# Patient Record
Sex: Male | Born: 1950 | Race: Black or African American | Hispanic: No | Marital: Single | Smoking: Never smoker
Health system: Southern US, Community
[De-identification: ages and names within clinical notes are randomized; demographics above are authoritative.]

## PROBLEM LIST (undated history)

## (undated) DIAGNOSIS — J189 Pneumonia, unspecified organism: Secondary | ICD-10-CM

## (undated) DIAGNOSIS — I10 Essential (primary) hypertension: Secondary | ICD-10-CM

## (undated) DIAGNOSIS — E119 Type 2 diabetes mellitus without complications: Secondary | ICD-10-CM

## (undated) DIAGNOSIS — N189 Chronic kidney disease, unspecified: Secondary | ICD-10-CM

## (undated) DIAGNOSIS — C61 Malignant neoplasm of prostate: Secondary | ICD-10-CM

## (undated) DIAGNOSIS — E039 Hypothyroidism, unspecified: Secondary | ICD-10-CM

## (undated) DIAGNOSIS — K219 Gastro-esophageal reflux disease without esophagitis: Secondary | ICD-10-CM

## (undated) DIAGNOSIS — N289 Disorder of kidney and ureter, unspecified: Secondary | ICD-10-CM

## (undated) DIAGNOSIS — E7800 Pure hypercholesterolemia, unspecified: Secondary | ICD-10-CM

---

## 1989-01-01 HISTORY — PX: APPENDECTOMY: SHX54

## 2003-01-02 HISTORY — PX: COLONOSCOPY: SHX174

## 2008-03-27 ENCOUNTER — Emergency Department (HOSPITAL_COMMUNITY): Admission: EM | Admit: 2008-03-27 | Discharge: 2008-03-27 | Payer: Self-pay | Admitting: Family Medicine

## 2008-07-31 ENCOUNTER — Emergency Department (HOSPITAL_COMMUNITY): Admission: EM | Admit: 2008-07-31 | Discharge: 2008-07-31 | Payer: Self-pay | Admitting: Emergency Medicine

## 2010-04-13 LAB — GLUCOSE, CAPILLARY: Glucose-Capillary: 140 mg/dL — ABNORMAL HIGH (ref 70–99)

## 2010-04-13 LAB — WOUND CULTURE

## 2010-05-16 NOTE — Op Note (Signed)
NAME:  Johnny Smith, Johnny Smith NO.:  [id number]   MEDICAL RECORD NO.:  [id number]          PATIENT TYPE:  EMS   LOCATION:  [facility]                         FACILITY:  [facility]   PHYSICIAN:  Loreta Ave, MD DATE OF BIRTH:  [birth year]   DATE OF PROCEDURE:  03/27/2008  DATE OF DISCHARGE:  03/27/2008                               OPERATIVE REPORT   PREPROCEDURE DIAGNOSIS:  Right long finger infection.   POSTPROCEDURE DIAGNOSIS:  Right long finger infection.   PROCEDURE:  Incision and drainage of right long finger abscess.   SURGEON:  Loreta Ave, MD   ANESTHESIA:  Local.   COMPLICATIONS:  None.   DRAINS:  None.   ESTIMATED BLOOD LOSS:  Minimal.   CLINICAL INDICATIONS:  [name] is a 60 year old diabetic male with  a right long finger infection.  He has developed a right long finger  infection over the last week that has progressed and is fluctuant and  pointing.   After discussion of the risks of surgery which include but are not  limited to stiffness, scarring, bleeding, infection that is ongoing,  damage to the nearby structures and the need for future surgery.  Ezri  understands these risks and desires to proceed.   DESCRIPTION OF PROCEDURE:  The patient was in his emergency room bed for  the duration of the procedure.  The right hand was prepped with Betadine  paint and the long finger was draped into a sterile field.  A 6 mL of a  combination of 1% lidocaine with 1:100,000 epinephrine mixed equally  with 0.5% Marcaine plain were injected to provide digital block to the  right long finger.  The abscess was located over the proximal phalanx  dorsally and extended 1.5 cm transversely and 1 cm longitudinally with  surrounding 1.5 cm halo of erythema.  For this reason, a transverse  incision was made over the abscess itself.  Purulence was encountered,  which was cultured and sent to microbiology, labeled right long finger  abscess cavity.  Next,  the wound was irrigated with 100 mL of normal  saline and Iodoform packing was placed in the wound.  A soft sterile  finger dressing was then applied.  The patient experienced no  complications, and there was minimal blood loss.     Loreta Ave, MD  Electronically Signed    CF/MEDQ  D:  03/28/2008  T:  03/29/2008  Job:  [id number]

## 2012-07-07 ENCOUNTER — Encounter (HOSPITAL_COMMUNITY): Payer: Self-pay | Admitting: *Deleted

## 2012-07-07 ENCOUNTER — Emergency Department (HOSPITAL_COMMUNITY)
Admission: EM | Admit: 2012-07-07 | Discharge: 2012-07-07 | Disposition: A | Discharge status: Home / Self Care | Payer: Self-pay | Source: Home / Self Care | Attending: Emergency Medicine | Admitting: Emergency Medicine

## 2012-07-07 DIAGNOSIS — N471 Phimosis: Secondary | ICD-10-CM

## 2012-07-07 DIAGNOSIS — N478 Other disorders of prepuce: Secondary | ICD-10-CM

## 2012-07-07 DIAGNOSIS — N481 Balanitis: Secondary | ICD-10-CM

## 2012-07-07 DIAGNOSIS — N476 Balanoposthitis: Secondary | ICD-10-CM

## 2012-07-07 HISTORY — DX: Essential (primary) hypertension: I10

## 2012-07-07 HISTORY — DX: Pure hypercholesterolemia, unspecified: E78.00

## 2012-07-07 HISTORY — DX: Gastro-esophageal reflux disease without esophagitis: K21.9

## 2012-07-07 HISTORY — DX: Type 2 diabetes mellitus without complications: E11.9

## 2012-07-07 MED ORDER — SULFAMETHOXAZOLE-TMP DS 800-160 MG PO TABS: 1.0000 | ORAL_TABLET | Freq: Two times a day (BID) | ORAL | Status: DC

## 2012-07-07 MED ORDER — NYSTATIN 100000 UNIT/GM EX CREA: TOPICAL_CREAM | CUTANEOUS | Status: DC

## 2012-07-07 MED ORDER — CEPHALEXIN 500 MG PO CAPS: 500.0000 mg | ORAL_CAPSULE | Freq: Three times a day (TID) | ORAL | Status: DC

## 2012-07-07 NOTE — ED Provider Notes (Signed)
Chief Complaint:   Chief Complaint  Patient presents with  . Penile Discharge    History of Present Illness:   Johnny Smith is a 62 year old diabetic male who has had a one-week history of phimosis. His foreskin has been swollen, slightly painful and irritated feeling, and he's had some discharge. He's been unable to retract his foreskin. He is able urinate well. He denies any fever or chills. No abdominal pain or inguinal adenopathy. His blood sugars under good control. His most recent blood sugar was 135 a couple days ago. He denies polyuria, polydipsia, or blurry vision. The patient states has been years since he's been sexually active due to erectile dysfunction. [place] diabetes control and does not have a local primary care Dr. or diabetes specialist.  Review of Systems:  Other than noted above, the patient denies any of the following symptoms: General:  No fevers, chills, sweats, aches, or fatigue. GI:  No abdominal pain, back pain, nausea, vomiting, diarrhea, or constipation. GU:  No dysuria, frequency, urgency, hematuria, urethral discharge, penile lesions, penile pain, testicular pain, swelling, or mass, inguinal lymphadenopathy or incontinence.  PMFSH:  Past medical history, family history, social history, meds, and allergies were reviewed.  He has diabetes, hypertension, hyperlipidemia, and GERD. Current meds include amlodipine/DL Sartain, Nexium, glipizide, Synthroid, metoprolol, Crestor, and Januvia.  Physical Exam:   Vital signs:  BP 147/91  Pulse 86  Temp(Src) 98.2 F (36.8 C) (Oral)  Resp 16  SpO2 100% Gen:  Alert, oriented, in no distress. Lungs:  Clear to auscultation, no wheezes, rales or rhonchi. Heart:  Regular rhythm, no gallop or murmer. Abdomen:  Flat and soft.  No tenderness to palpation, guarding, or rebound.  No hepato-splenomegaly or mass.  Bowel sounds were normally active.  No hernia. Genital exam:  The foreskin  is slightly swollen, erythematous, and there is cracking of the foreskin with some meal and drainage. The foreskin could not be retracted. Back:  No CVA tenderness.  Skin:  Clear, warm and dry.  Labs:   Results for orders placed during the hospital encounter of 07/07/12  GLUCOSE, CAPILLARY      Result Value Range   Glucose-Capillary 129 (*) 70 - 99 mg/dL    Assessment: The primary encounter diagnosis was Phimosis. A diagnosis of Balanitis was also pertinent to this visit.   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 He will need a circumcision. He has no insurance. I suggested that he followup with the [facility] and [facility], get an orange card, and get referred to urology.  Plan:   1.  The following meds were prescribed:   New Prescriptions   CEPHALEXIN (KEFLEX) 500 MG CAPSULE    Take 1 capsule (500 mg total) by mouth 3 (three) times daily.   NYSTATIN CREAM (MYCOSTATIN)    Apply to affected area 2 times daily   SULFAMETHOXAZOLE-TRIMETHOPRIM (BACTRIM DS) 800-160 MG PER TABLET    Take 1 tablet by mouth 2 (two) times daily.   2.  The patient was instructed in symptomatic care and handouts were given. 3.  The patient was told to return if becoming worse in any way, if no better in 3 or 4 days, and given some red flag symptoms such as difficulty urinating that would indicate earlier return. 4.  Follow up with Dr. Margarita Grizzle for circumcision. Also suggest he followup with Dr. Standley Dakins at the [facility] and [facility]  for diabetes control.     Reuben Likes, MD 07/07/12 [phone]

## 2012-07-07 NOTE — ED Notes (Signed)
States he is uncircumcised and he can't retract his penis to clean and has a slight discharge since Thursday.

## 2012-07-16 ENCOUNTER — Ambulatory Visit: Payer: Self-pay

## 2012-07-16 ENCOUNTER — Encounter: Payer: Self-pay | Admitting: Family Medicine

## 2012-07-16 ENCOUNTER — Ambulatory Visit: Payer: No Typology Code available for payment source | Attending: Family Medicine | Admitting: Family Medicine

## 2012-07-16 VITALS — BP 123/79 | HR 54 | Temp 98.5°F | Resp 18 | Ht 70.0 in | Wt 178.4 lb

## 2012-07-16 DIAGNOSIS — E039 Hypothyroidism, unspecified: Secondary | ICD-10-CM | POA: Insufficient documentation

## 2012-07-16 DIAGNOSIS — N471 Phimosis: Secondary | ICD-10-CM

## 2012-07-16 DIAGNOSIS — N478 Other disorders of prepuce: Secondary | ICD-10-CM

## 2012-07-16 DIAGNOSIS — N476 Balanoposthitis: Secondary | ICD-10-CM

## 2012-07-16 DIAGNOSIS — E119 Type 2 diabetes mellitus without complications: Secondary | ICD-10-CM

## 2012-07-16 DIAGNOSIS — N481 Balanitis: Secondary | ICD-10-CM

## 2012-07-16 DIAGNOSIS — E785 Hyperlipidemia, unspecified: Secondary | ICD-10-CM | POA: Insufficient documentation

## 2012-07-16 LAB — CBC
HCT: 40.4 % (ref 39.0–52.0)
Hemoglobin: 13.4 g/dL (ref 13.0–17.0)
MCH: 29.5 pg (ref 26.0–34.0)
MCV: 88.8 fL (ref 78.0–100.0)
RBC: 4.55 MIL/uL (ref 4.22–5.81)
WBC: 4.9 10*3/uL (ref 4.0–10.5)

## 2012-07-16 NOTE — Progress Notes (Signed)
07/16/12 Present to clinic f/u from ER visit penile swelling and  Foreskin infection.  P.Melissaann Dizdarevic,RN BSN [facility]

## 2012-07-16 NOTE — Patient Instructions (Addendum)
Blood Sugar Monitoring, Adult GLUCOSE METERS FOR SELF-MONITORING OF BLOOD GLUCOSE  It is important to be able to correctly measure your blood sugar (glucose). You can use a blood glucose monitor (a small battery-operated device) to check your glucose level at any time. This allows you and your caregiver to monitor your diabetes and to determine how well your treatment plan is working. The process of monitoring your blood glucose with a glucose meter is called self-monitoring of blood glucose (SMBG). When people with diabetes control their blood sugar, they have better health. To test for glucose with a typical glucose meter, place the disposable strip in the meter. Then place a small sample of blood on the "test strip." The test strip is coated with chemicals that combine with glucose in blood. The meter measures how much glucose is present. The meter displays the glucose level as a number. Several new models can record and store a number of test results. Some models can connect to personal computers to store test results or print them out.  Newer meters are often easier to use than older models. Some meters allow you to get blood from places other than your fingertip. Some new models have automatic timing, error codes, signals, or barcode readers to help with proper adjustment (calibration). Some meters have a large display screen or spoken instructions for people with visual impairments.  INSTRUCTIONS FOR USING GLUCOSE METERS  Wash your hands with soap and warm water, or clean the area with alcohol. Dry your hands completely.  Prick the side of your fingertip with a lancet (a sharp-pointed tool used by hand).  Hold the hand down and gently milk the finger until a small drop of blood appears. Catch the blood with the test strip.  Follow the instructions for inserting the test strip and using the SMBG meter. Most meters require the meter to be turned on and the test strip to be inserted before applying  the blood sample.  Record the test result.  Read the instructions carefully for both the meter and the test strips that go with it. Meter instructions are found in the user manual. Keep this manual to help you solve any problems that may arise. Many meters use "error codes" when there is a problem with the meter, the test strip, or the blood sample on the strip. You will need the manual to understand these error codes and fix the problem.  New devices are available such as laser lancets and meters that can test blood taken from "alternative sites" of the body, other than fingertips. However, you should use standard fingertip testing if your glucose changes rapidly. Also, use standard testing if:  You have eaten, exercised, or taken insulin in the past 2 hours.  You think your glucose is low.  You tend to not feel symptoms of low blood glucose (hypoglycemia).  You are ill or under stress.  Clean the meter as directed by the manufacturer.  Test the meter for accuracy as directed by the manufacturer.  Take your meter with you to your caregiver's office. This way, you can test your glucose in front of your caregiver to make sure you are using the meter correctly. Your caregiver can also take a sample of blood to test using a routine lab method. If values on the glucose meter are close to the lab results, you and your caregiver will see that your meter is working well and you are using good technique. Your caregiver will advise you about what   to do if the results do not match. FREQUENCY OF TESTING  Your caregiver will tell you how often you should check your blood glucose. This will depend on your type of diabetes, your current level of diabetes control, and your types of medicines. The following are general guidelines, but your care plan may be different. Record all your readings and the time of day you took them for review with your caregiver.   Diabetes type 1.  When you are using insulin  with good diabetic control (either multiple daily injections or via a pump), you should check your glucose 4 times a day.  If your diabetes is not well controlled, you may need to monitor more frequently, including before meals and 2 hours after meals, at bedtime, and occasionally between 2 a.m. and 3 a.m.  You should always check your glucose before a dose of insulin or before changing the rate on your insulin pump.  Diabetes type 2.  Guidelines for SMBG in diabetes type 2 are not as well defined.  If you are on insulin, follow the guidelines above.  If you are on medicines, but not insulin, and your glucose is not well controlled, you should test at least twice daily.  If you are not on insulin, and your diabetes is controlled with medicines or diet alone, you should test at least once daily, usually before breakfast.  A weekly profile will help your caregiver advise you on your care plan. The week before your visit, check your glucose before a meal and 2 hours after a meal at least daily. You may want to test before and after a different meal each day so you and your caregiver can tell how well controlled your blood sugars are throughout the course of a 24 hour period.  Gestational diabetes (diabetes during pregnancy).  Frequent testing is often necessary. Accurate timing is important.  If you are not on insulin, check your glucose 4 times a day. Check it before breakfast and 1 hour after the start of each meal.  If you are on insulin, check your glucose 6 times a day. Check it before each meal and 1 hour after the first bite of each meal.  General guidelines.  More frequent testing is required at the start of insulin treatment. Your caregiver will instruct you.  Test your glucose any time you suspect you have low blood sugar (hypoglycemia).  You should test more often when you change medicines, when you have unusual stress or illness, or in other unusual circumstances. OTHER  THINGS TO KNOW ABOUT GLUCOSE METERS  Measurement Range. Most glucose meters are able to read glucose levels over a broad range of values from as low as 0 to as high as 600 mg/dL. If you get an extremely high or low reading from your meter, you should first confirm it with another reading. Report very high or very low readings to your caregiver.  Whole Blood Glucose versus Plasma Glucose. Some older home glucose meters measure glucose in your whole blood. In a lab or when using some newer home glucose meters, the glucose is measured in your plasma (one component of blood). The difference can be important. It is important for you and your caregiver to know whether your meter gives its results as "whole blood equivalent" or "plasma equivalent."  Display of High and Low Glucose Values. Part of learning how to operate a meter is understanding what the meter results mean. Know how high and low glucose concentrations are displayed   on your meter.  Factors that Affect Glucose Meter Performance. The accuracy of your test results depends on many factors and varies depending on the brand and type of meter. These factors include:  Low red blood cell count (anemia).  Substances in your blood (such as uric acid, vitamin C, and others).  Environmental factors (temperature, humidity, altitude).  Name-brand versus generic test strips.  Calibration. Make sure your meter is set up properly. It is a good idea to do a calibration test with a control solution recommended by the manufacturer of your meter whenever you begin using a fresh bottle of test strips. This will help verify the accuracy of your meter.  Improperly stored, expired, or defective test strips. Keep your strips in a dry place with the lid on.  Soiled meter.  Inadequate blood sample. NEW TECHNOLOGIES FOR GLUCOSE TESTING Alternative site testing Some glucose meters allow testing blood from alternative sites. These include the:  Upper  arm.  Forearm.  Base of the thumb.  Thigh. Sampling blood from alternative sites may be desirable. However, it may have some limitations. Blood in the fingertips show changes in glucose levels more quickly than blood in other parts of the body. This means that alternative site test results may be different from fingertip test results, not because of the meter's ability to test accurately, but because the actual glucose concentration can be different.  Continuous Glucose Monitoring Devices to measure your blood glucose continuously are available, and others are in development. These methods can be more expensive than self-monitoring with a glucose meter. However, it is uncertain how effective and reliable these devices are. Your caregiver will advise you if this approach makes sense for you. IF BLOOD SUGARS ARE CONTROLLED, PEOPLE WITH DIABETES REMAIN HEALTHIER.  SMBG is an important part of the treatment plan of patients with diabetes mellitus. Below are reasons for using SMBG:   It confirms that your glucose is at a specific, healthy level.  It detects hypoglycemia and severe hyperglycemia.  It allows you and your caregiver to make adjustments in response to changes in lifestyle for individuals requiring medicine.  It determines the need for starting insulin therapy in temporary diabetes that happens during pregnancy (gestational diabetes). Document Released: 12/21/2002 Document Revised: 03/12/2011 Document Reviewed: 04/13/2010 [employer] Patient Information 2014 [employer] 1800 Calorie Diet for Diabetes Meal Planning The 1800 calorie diet is designed for eating up to 1800 calories each day. Following this diet and making healthy meal choices can help improve overall health. This diet controls blood sugar (glucose) levels and can also help lower blood pressure and cholesterol. SERVING SIZES Measuring foods and serving sizes helps to make sure you are getting the right amount of food. The  list below tells how big or small some common serving sizes are:  1 oz.........4 stacked dice.  3 oz.........Deck of cards.  1 tsp........Tip of little finger.  1 tbs........Thumb.  2 tbs........Golf ball.   cup.......Half of a fist.  1 cup........A fist. GUIDELINES FOR CHOOSING FOODS The goal of this diet is to eat a variety of foods and limit calories to 1800 each day. This can be done by choosing foods that are low in calories and fat. The diet also suggests eating small amounts of food frequently. Doing this helps control your blood glucose levels so they do not get too high or too low. Each meal or snack may include a protein food source to help you feel more satisfied and to stabilize your blood glucose. Try   to eat about the same amount of food around the same time each day. This includes weekend days, travel days, and days off work. Space your meals about 4 to 5 hours apart and add a snack between them if you wish.  For example, a daily food plan could include breakfast, a morning snack, lunch, dinner, and an evening snack. Healthy meals and snacks include whole grains, vegetables, fruits, lean meats, poultry, fish, and dairy products. As you plan your meals, select a variety of foods. Choose from the bread and starch, vegetable, fruit, dairy, and meat/protein groups. Examples of foods from each group and their suggested serving sizes are listed below. Use measuring cups and spoons to become familiar with what a healthy portion looks like. Bread and Starch Each serving equals 15 grams of carbohydrates.  1 slice bread.   bagel.   cup cold cereal (unsweetened).   cup hot cereal or mashed potatoes.  1 small potato (size of a computer mouse).   cup cooked pasta or rice.   English muffin.  1 cup broth-based soup.  3 cups of popcorn.  4 to 6 whole-wheat crackers.   cup cooked beans, peas, or corn. Vegetable Each serving equals 5 grams of carbohydrates.   cup  cooked vegetables.  1 cup raw vegetables.   cup tomato or vegetable juice. Fruit Each serving equals 15 grams of carbohydrates.  1 small apple or orange.  1 cup watermelon or strawberries.   cup applesauce (no sugar added).  2 tbs raisins.   banana.   cup canned fruit, packed in water, its own juice, or sweetened with a sugar substitute.   cup unsweetened fruit juice. Dairy Each serving equals 12 to 15 grams of carbohydrates.  1 cup fat-free milk.  6 oz artificially sweetened yogurt or plain yogurt.  1 cup low-fat buttermilk.  1 cup soy milk.  1 cup almond milk. Meat/Protein  1 large egg.  2 to 3 oz meat, poultry, or fish.   cup low-fat cottage cheese.  1 tbs peanut butter.  1 oz low-fat cheese.   cup tuna in water.   cup tofu. Fat  1 tsp oil.  1 tsp trans-fat-free margarine.  1 tsp butter.  1 tsp mayonnaise.  2 tbs avocado.  1 tbs salad dressing.  1 tbs cream cheese.  2 tbs sour cream. SAMPLE 1800 CALORIE DIET PLAN Breakfast   cup unsweetened cereal (1 carb serving).  1 cup fat-free milk (1 carb serving).  1 slice whole-wheat toast (1 carb serving).   small banana (1 carb serving).  1 scrambled egg.  1 tsp trans-fat-free margarine. Lunch  Tuna sandwich.  2 slices whole-wheat bread (2 carb servings).   cup canned tuna in water, drained.  1 tbs reduced fat mayonnaise.  1 stalk celery, chopped.  2 slices tomato.  1 lettuce leaf.  1 cup carrot sticks.  24 to 30 seedless grapes (2 carb servings).  6 oz light yogurt (1 carb serving). Afternoon Snack  3 graham cracker squares (1 carb serving).  Fat-free milk, 1 cup (1 carb serving).  1 tbs peanut butter. Dinner  3 oz salmon, broiled with 1 tsp oil.  1 cup mashed potatoes (2 carb servings) with 1 tsp trans-fat-free margarine.  1 cup fresh or frozen green beans.  1 cup steamed asparagus.  1 cup fat-free milk (1 carb serving). Evening Snack  3  cups air-popped popcorn (1 carb serving).  2 tbs parmesan cheese sprinkled on top. MEAL PLAN Use this worksheet   to help you make a daily meal plan based on the 1800 calorie diet suggestions. If you are using this plan to help you control your blood glucose, you may interchange carbohydrate-containing foods (dairy, starches, and fruits). Select a variety of fresh foods of varying colors and flavors. The total amount of carbohydrate in your meals or snacks is more important than making sure you include all of the food groups every time you eat. Choose from the following foods to build your day's meals:  8 Starches.  4 Vegetables.  3 Fruits.  2 Dairy.  6 to 7 oz Meat/Protein.  Up to 4 Fats. Your dietician can use this worksheet to help you decide how many servings and which types of foods are right for you. BREAKFAST Food Group and Servings / Food Choice Starch ________________________________________________________ Dairy _________________________________________________________ Fruit _________________________________________________________ Meat/Protein __________________________________________________ Fat ___________________________________________________________ LUNCH Food Group and Servings / Food Choice Starch ________________________________________________________ Meat/Protein __________________________________________________ Vegetable _____________________________________________________ Fruit _________________________________________________________ Dairy _________________________________________________________ Fat ___________________________________________________________ AFTERNOON SNACK Food Group and Servings / Food Choice Starch ________________________________________________________ Meat/Protein __________________________________________________ Fruit __________________________________________________________ Dairy  _________________________________________________________ DINNER Food Group and Servings / Food Choice Starch _________________________________________________________ Meat/Protein ___________________________________________________ Dairy __________________________________________________________ Vegetable ______________________________________________________ Fruit ___________________________________________________________ Fat ____________________________________________________________ EVENING SNACK Food Group and Servings / Food Choice Fruit __________________________________________________________ Meat/Protein ___________________________________________________ Dairy __________________________________________________________ Starch _________________________________________________________ DAILY TOTALS Starch ____________________________ Vegetable _________________________ Fruit _____________________________ Dairy _____________________________ Meat/Protein______________________ Fat _______________________________ Document Released: 07/10/2004 Document Revised: 03/12/2011 Document Reviewed: 11/03/2010 [employer] Patient Information 2014 [employer].  

## 2012-07-16 NOTE — Progress Notes (Signed)
Patient ID: Johnny Smith, male   DOB: March 22, 1950, 62 y.o.   MRN: [id number]  CC: need referral to urologist  HPI: Pt was recently diagnosed with phimosis and balanitis at urgent care.  He was started on antibiotics and sent her for referral to urology.  He has no GCCN card.  He is going to need a circumcision.  HE reports that he is feeling much better now and completing the 2 antibiotics prescribed at urgent care.  He has controlled DM.    No Known Allergies Past Medical History  Diagnosis Date  . Diabetes mellitus without complication   . Hypertension   . GERD (gastroesophageal reflux disease)   . High cholesterol    Current Outpatient Prescriptions on File Prior to Visit  Medication Sig Dispense Refill  . amLODipine-valsartan (EXFORGE) 10-320 MG per tablet Take 1 tablet by mouth daily.      . cephALEXin (KEFLEX) 500 MG capsule Take 1 capsule (500 mg total) by mouth 3 (three) times daily.  30 capsule  0  . esomeprazole (NEXIUM) 40 MG capsule Take 40 mg by mouth daily before breakfast.      . glipiZIDE (GLUCOTROL) 5 MG tablet Take 5 mg by mouth 2 (two) times daily before a meal.      . levothyroxine (SYNTHROID, LEVOTHROID) 112 MCG tablet Take 112 mcg by mouth daily before breakfast.      . metoprolol succinate (TOPROL-XL) 100 MG 24 hr tablet Take 100 mg by mouth daily. Take with or immediately following a meal.      . nystatin cream (MYCOSTATIN) Apply to affected area 2 times daily  30 g  0  . rosuvastatin (CRESTOR) 10 MG tablet Take 10 mg by mouth daily.      . rosuvastatin (CRESTOR) 20 MG tablet Take 20 mg by mouth daily.      . sitaGLIPtin (JANUVIA) 100 MG tablet Take 100 mg by mouth daily.      Marland Kitchen sulfamethoxazole-trimethoprim (BACTRIM DS) 800-160 MG per tablet Take 1 tablet by mouth 2 (two) times daily.  20 tablet  0   No current facility-administered medications on file prior to visit.   Family History  Problem Relation Age of Onset  . Stroke Mother   . Heart attack Father     History   Social History  . Marital Status: Single    Spouse Name: N/A    Number of Children: N/A  . Years of Education: N/A   Occupational History  . Not on file.   Social History Main Topics  . Smoking status: Never Smoker   . Smokeless tobacco: Not on file  . Alcohol Use: No  . Drug Use: No  . Sexually Active: Not on file   Other Topics Concern  . Not on file   Social History Narrative  . No narrative on file    Review of Systems  Constitutional: Negative for fever, chills, diaphoresis, activity change, appetite change and fatigue.  HENT: Negative for ear pain, nosebleeds, congestion, facial swelling, rhinorrhea, neck pain, neck stiffness and ear discharge.   Eyes: Negative for pain, discharge, redness, itching and visual disturbance.  Respiratory: Negative for cough, choking, chest tightness, shortness of breath, wheezing and stridor.   Cardiovascular: Negative for chest pain, palpitations and leg swelling.  Gastrointestinal: Negative for abdominal distention.  Genitourinary: Negative for dysuria, urgency, frequency, hematuria, flank pain, decreased urine volume, difficulty urinating and dyspareunia.  Musculoskeletal: Negative for back pain, joint swelling, arthralgias and gait problem.  Neurological: Negative  for dizziness, tremors, seizures, syncope, facial asymmetry, speech difficulty, weakness, light-headedness, numbness and headaches.  Hematological: Negative for adenopathy. Does not bruise/bleed easily.  Psychiatric/Behavioral: Negative for hallucinations, behavioral problems, confusion, dysphoric mood, decreased concentration and agitation.    Objective:   Filed Vitals:   07/16/12 1717  BP: 123/79  Pulse: 54  Temp: 98.5 F (36.9 C)  Resp: 18    Physical Exam  Constitutional: Appears well-developed and well-nourished. No distress.  HENT: Normocephalic. External right and left ear normal. Oropharynx is clear and moist.  Eyes: Conjunctivae and EOM are  normal. PERRLA, no scleral icterus.  Neck: Normal ROM. Neck supple. No JVD. No tracheal deviation. No thyromegaly.  CVS: RRR, S1/S2 +, no murmurs, no gallops, no carotid bruit.  Pulmonary: Effort and breath sounds normal, no stridor, rhonchi, wheezes, rales.  Abdominal: Soft. BS +,  no distension, tenderness, rebound or guarding.  GU: uncircumcised male, no signs of penile infection or balanitis.  Unable to fully retract foreskin.  Musculoskeletal: Normal range of motion. No edema and no tenderness.  Lymphadenopathy: No lymphadenopathy noted, cervical, inguinal. Neuro: Alert. Normal reflexes, muscle tone coordination. No cranial nerve deficit. Skin: Skin is warm and dry. No rash noted. Not diaphoretic. No erythema. No pallor.  Psychiatric: Normal mood and affect. Behavior, judgment, thought content normal.   No results found for this basename: WBC, HGB, HCT, MCV, PLT   No results found for this basename: CREATININE, BUN, NA, K, CL, CO2    No results found for this basename: HGBA1C   Lipid Panel  No results found for this basename: chol, trig, hdl, cholhdl, vldl, ldlcalc      Assessment and plan:   Patient Active Problem List   Diagnosis Date Noted  . Phimosis 07/16/2012  . Unspecified hypothyroidism 07/16/2012  . Dyslipidemia 07/16/2012  . Balanitis 07/16/2012   Phimosis - Pt is being referred to urology for circumcision.   Referral made to Dr. [facility] as recommended by Dr. Lorenz Coaster  The patient was given clear instructions to go to ER or return to medical center if symptoms don't improve, worsen or new problems develop.  The patient verbalized understanding.  The patient was told to call to get any lab results if not heard anything in the next week.    RTC in 4 months  Rodney Langton, MD, CDE, FAAFP [facility] [place], [place]

## 2012-07-17 ENCOUNTER — Telehealth: Payer: Self-pay | Admitting: *Deleted

## 2012-07-17 DIAGNOSIS — E119 Type 2 diabetes mellitus without complications: Secondary | ICD-10-CM | POA: Insufficient documentation

## 2012-07-17 LAB — COMPLETE METABOLIC PANEL WITH GFR
ALT: 20 U/L (ref 0–53)
BUN: 16 mg/dL (ref 6–23)
CO2: 25 mEq/L (ref 19–32)
Calcium: 10 mg/dL (ref 8.4–10.5)
Chloride: 100 mEq/L (ref 96–112)
Creat: 1.61 mg/dL — ABNORMAL HIGH (ref 0.50–1.35)
GFR, Est African American: 53 mL/min — ABNORMAL LOW
GFR, Est Non African American: 45 mL/min — ABNORMAL LOW
Glucose, Bld: 273 mg/dL — ABNORMAL HIGH (ref 70–99)
Total Bilirubin: 0.4 mg/dL (ref 0.3–1.2)

## 2012-07-17 LAB — LIPID PANEL
Cholesterol: 140 mg/dL (ref 0–200)
HDL: 28 mg/dL — ABNORMAL LOW (ref >39)
LDL Cholesterol: 88 mg/dL (ref 0–99)
Triglycerides: 122 mg/dL (ref <150)

## 2012-07-17 NOTE — Telephone Encounter (Signed)
07/17/12 Patient informed that his A1c is elevated at 8.5% which means poorly controlled Diabetes. Patient instructed to  Make better food choices. Appointment schedule for September 2014. P.Jeret Goyer,RN BSN [facility]

## 2012-07-17 NOTE — Progress Notes (Signed)
Quick Note:  Please inform patient that his A1c is elevated at 8.5% which means that his diabetes mellitus is poorly controlled. He is at high risk for acute and chronic complications of poorly controlled diabetes mellitus. Please have him come in for an office visit in the next couple of months to discuss his diabetes treatment options. Please have patient work harder to get his diabetes under better control. Other labs came back OK.   Rodney Langton, MD, CDE, FAAFP [facility] [place], [place]   ______

## 2012-08-19 ENCOUNTER — Telehealth: Payer: Self-pay | Admitting: Family Medicine

## 2012-08-19 NOTE — Telephone Encounter (Signed)
Pt would like to have the following scripts refilled: sitaGLIPtin (JANUVIA) 100 MG tablet , potassium chloride SA (K-DUR,KLOR-CON) 20 MEQ tablet, nystatin cream (MYCOSTATIN)  & glipiZIDE (GLUCOTROL) 5 MG tablet; pt uses [facility] on [address] Pt also inquired about getting Januvia at [facility] at a cheaper rate

## 2012-09-17 ENCOUNTER — Ambulatory Visit: Payer: No Typology Code available for payment source | Attending: Internal Medicine | Admitting: Internal Medicine

## 2012-09-17 ENCOUNTER — Encounter: Payer: Self-pay | Admitting: Internal Medicine

## 2012-09-17 VITALS — BP 133/85 | HR 64 | Temp 98.5°F | Resp 16 | Ht 69.69 in | Wt 177.0 lb

## 2012-09-17 DIAGNOSIS — Z09 Encounter for follow-up examination after completed treatment for conditions other than malignant neoplasm: Secondary | ICD-10-CM | POA: Insufficient documentation

## 2012-09-17 DIAGNOSIS — K219 Gastro-esophageal reflux disease without esophagitis: Secondary | ICD-10-CM | POA: Insufficient documentation

## 2012-09-17 DIAGNOSIS — E039 Hypothyroidism, unspecified: Secondary | ICD-10-CM | POA: Insufficient documentation

## 2012-09-17 DIAGNOSIS — Z79899 Other long term (current) drug therapy: Secondary | ICD-10-CM | POA: Insufficient documentation

## 2012-09-17 DIAGNOSIS — E785 Hyperlipidemia, unspecified: Secondary | ICD-10-CM | POA: Insufficient documentation

## 2012-09-17 DIAGNOSIS — E119 Type 2 diabetes mellitus without complications: Secondary | ICD-10-CM | POA: Insufficient documentation

## 2012-09-17 DIAGNOSIS — I10 Essential (primary) hypertension: Secondary | ICD-10-CM | POA: Insufficient documentation

## 2012-09-17 MED ORDER — POTASSIUM CHLORIDE CRYS ER 20 MEQ PO TBCR: 20.0000 meq | EXTENDED_RELEASE_TABLET | Freq: Every day | ORAL | Status: DC

## 2012-09-17 NOTE — Progress Notes (Signed)
Pt is here for a F/U Pt is here to F/U on his diabetes.

## 2012-09-17 NOTE — Progress Notes (Signed)
Patient Demographics  Johnny Smith, is a 62 y.o. male  ZOX:[id number]  WJX:[id number]  DOB - [birth year]  Chief Complaint  Patient presents with  . Follow-up        Subjective:   Johnny Smith today is here for a follow up visit. He has no major complaints. He has a history of diabetes, hypertension and dyslipidemia. He was recently seen in the urology clinic for balanitis and phimosis.  Patient has No headache, No chest pain, No abdominal pain - No Nausea, No new weakness tingling or numbness, No Cough - SOB.   Objective:    Filed Vitals:   09/17/12 0950  BP: 133/85  Pulse: 64  Temp: 98.5 F (36.9 C)  TempSrc: Oral  Resp: 16  Height: 5' 9.69" (1.77 m)  Weight: 177 lb (80.287 kg)  SpO2: 98%     ALLERGIES:  No Known Allergies  PAST MEDICAL HISTORY: Past Medical History  Diagnosis Date  . Diabetes mellitus without complication   . Hypertension   . GERD (gastroesophageal reflux disease)   . High cholesterol     MEDICATIONS AT HOME: Prior to Admission medications   Medication Sig Start Date End Date Taking? Authorizing Provider  amLODipine-valsartan (EXFORGE) 10-320 MG per tablet Take 1 tablet by mouth daily.   Yes Historical Provider, MD  esomeprazole (NEXIUM) 40 MG capsule Take 40 mg by mouth daily before breakfast.   Yes Historical Provider, MD  fenofibrate (TRICOR) 145 MG tablet Take 145 mg by mouth daily.   Yes Historical Provider, MD  glipiZIDE (GLUCOTROL) 5 MG tablet Take 5 mg by mouth 2 (two) times daily before a meal.   Yes Historical Provider, MD  levothyroxine (SYNTHROID, LEVOTHROID) 112 MCG tablet Take 112 mcg by mouth daily before breakfast.   Yes Historical Provider, MD  metoprolol succinate (TOPROL-XL) 100 MG 24 hr tablet Take 100 mg by mouth daily. Take with or immediately following a meal.   Yes Historical Provider, MD  nystatin cream (MYCOSTATIN) Apply to affected area 2 times daily 07/07/12  Yes Reuben Likes, MD  potassium chloride SA  (K-DUR,KLOR-CON) 20 MEQ tablet Take 20 mEq by mouth daily.   Yes Historical Provider, MD  rosuvastatin (CRESTOR) 10 MG tablet Take 10 mg by mouth daily.   Yes Historical Provider, MD  rosuvastatin (CRESTOR) 20 MG tablet Take 20 mg by mouth daily.   Yes Historical Provider, MD  sitaGLIPtin (JANUVIA) 100 MG tablet Take 100 mg by mouth daily.   Yes Historical Provider, MD  cephALEXin (KEFLEX) 500 MG capsule Take 1 capsule (500 mg total) by mouth 3 (three) times daily. 07/07/12   Reuben Likes, MD  sulfamethoxazole-trimethoprim (BACTRIM DS) 800-160 MG per tablet Take 1 tablet by mouth 2 (two) times daily. 07/07/12   Reuben Likes, MD     Exam  General appearance :Awake, alert, not in any distress. Speech Clear. Not toxic Looking HEENT: Atraumatic and Normocephalic, pupils equally reactive to light and accomodation Neck: supple, no JVD. No cervical lymphadenopathy.  Chest:Good air entry bilaterally, no added sounds  CVS: S1 S2 regular, no murmurs.  Abdomen: Bowel sounds present, Non tender and not distended with no gaurding, rigidity or rebound. Extremities: B/L Lower Ext shows no edema, both legs are warm to touch Neurology: Awake alert, and oriented X 3, CN II-XII intact, Non focal Skin:No Rash Wounds:N/A    Data Review   CBC No results found for this basename: WBC, HGB, HCT, PLT, MCV, MCH, MCHC, RDW, NEUTRABS, LYMPHSABS, MONOABS, EOSABS,  BASOSABS, BANDABS, BANDSABD,  in the last 168 hours  Chemistries   No results found for this basename: NA, K, CL, CO2, GLUCOSE, BUN, CREATININE, GFRCGP, CALCIUM, MG, AST, ALT, ALKPHOS, BILITOT,  in the last 168 hours ------------------------------------------------------------------------------------------------------------------ No results found for this basename: HGBA1C,  in the last 72 hours ------------------------------------------------------------------------------------------------------------------ No results found for this basename: CHOL,  HDL, LDLCALC, TRIG, CHOLHDL, LDLDIRECT,  in the last 72 hours ------------------------------------------------------------------------------------------------------------------ No results found for this basename: TSH, T4TOTAL, FREET3, T3FREE, THYROIDAB,  in the last 72 hours ------------------------------------------------------------------------------------------------------------------ No results found for this basename: VITAMINB12, FOLATE, FERRITIN, TIBC, IRON, RETICCTPCT,  in the last 72 hours  Coagulation profile  No results found for this basename: INR, PROTIME,  in the last 168 hours    Assessment & Plan   Diabetes - Last A1c on 7/15 was 8.5 - He is currently on 5 mg of glipizide twice daily and Januvia 100 mg daily. I recommended to add either metformin or Actos, however he would want to wait another month or so and recheck A1c. He claims that when his A1c was done in July, he was not taking any medications it may not reflect his current state. - We'll recheck A1c in 6 weeks, have him followup in she continued to add another medication.  Hypertension - Currently controlled with Exforge and Toprol  Dyslipidemia - Crestor - Recheck lipid profile prior to the next visit  Hypothyroidism - Continue with levothyroxine - Recheck TSH 5 next visit   Health Maintenance -Colonoscopy: Claims that  he had in 2004/2005 [place]-will refer to GI -Vaccinations:  -Influenza- refuses flu vaccine  Follow up in 6 weeks   The patient was given clear instructions to go to ER or return to medical center if symptoms don't improve, worsen or new problems develop. The patient verbalized understanding. The patient was told to call to get lab results if they haven't heard anything in the next week.

## 2012-10-29 ENCOUNTER — Ambulatory Visit: Payer: No Typology Code available for payment source

## 2012-11-11 ENCOUNTER — Ambulatory Visit: Payer: No Typology Code available for payment source | Attending: Internal Medicine | Admitting: Internal Medicine

## 2012-11-11 VITALS — BP 152/84 | HR 78 | Temp 97.9°F | Resp 16

## 2012-11-11 DIAGNOSIS — E039 Hypothyroidism, unspecified: Secondary | ICD-10-CM

## 2012-11-11 DIAGNOSIS — E119 Type 2 diabetes mellitus without complications: Secondary | ICD-10-CM | POA: Insufficient documentation

## 2012-11-11 DIAGNOSIS — E1110 Type 2 diabetes mellitus with ketoacidosis without coma: Secondary | ICD-10-CM

## 2012-11-11 DIAGNOSIS — I10 Essential (primary) hypertension: Secondary | ICD-10-CM | POA: Insufficient documentation

## 2012-11-11 DIAGNOSIS — E1310 Other specified diabetes mellitus with ketoacidosis without coma: Secondary | ICD-10-CM

## 2012-11-11 DIAGNOSIS — E785 Hyperlipidemia, unspecified: Secondary | ICD-10-CM

## 2012-11-11 LAB — LIPID PANEL
Cholesterol: 154 mg/dL (ref 0–200)
LDL Cholesterol: 97 mg/dL (ref 0–99)
Total CHOL/HDL Ratio: 4.3 Ratio

## 2012-11-11 LAB — TSH: TSH: 0.52 u[IU]/mL (ref 0.350–4.500)

## 2012-11-11 LAB — BASIC METABOLIC PANEL
CO2: 28 mEq/L (ref 19–32)
Calcium: 9.7 mg/dL (ref 8.4–10.5)
Creat: 1.28 mg/dL (ref 0.50–1.35)
Glucose, Bld: 173 mg/dL — ABNORMAL HIGH (ref 70–99)
Sodium: 141 mEq/L (ref 135–145)

## 2012-11-11 LAB — POCT GLYCOSYLATED HEMOGLOBIN (HGB A1C): Hemoglobin A1C: 7.3

## 2012-11-11 MED ORDER — FENOFIBRATE 145 MG PO TABS: 145.0000 mg | ORAL_TABLET | Freq: Every day | ORAL | Status: DC

## 2012-11-11 MED ORDER — METOPROLOL SUCCINATE ER 100 MG PO TB24: 100.0000 mg | ORAL_TABLET | Freq: Every day | ORAL | Status: DC

## 2012-11-11 MED ORDER — LEVOTHYROXINE SODIUM 112 MCG PO TABS: 112.0000 ug | ORAL_TABLET | Freq: Every day | ORAL | Status: DC

## 2012-11-11 MED ORDER — GLIPIZIDE 5 MG PO TABS: 5.0000 mg | ORAL_TABLET | Freq: Two times a day (BID) | ORAL | Status: DC

## 2012-11-11 MED ORDER — ROSUVASTATIN CALCIUM 10 MG PO TABS: 10.0000 mg | ORAL_TABLET | Freq: Every day | ORAL | Status: DC

## 2012-11-11 MED ORDER — ESOMEPRAZOLE MAGNESIUM 40 MG PO CPDR: 40.0000 mg | DELAYED_RELEASE_CAPSULE | Freq: Every day | ORAL | Status: DC

## 2012-11-11 MED ORDER — ROSUVASTATIN CALCIUM 20 MG PO TABS: 20.0000 mg | ORAL_TABLET | Freq: Every day | ORAL | Status: DC

## 2012-11-11 MED ORDER — SITAGLIPTIN PHOSPHATE 100 MG PO TABS: 100.0000 mg | ORAL_TABLET | Freq: Every day | ORAL | Status: DC

## 2012-11-11 MED ORDER — AMLODIPINE BESYLATE-VALSARTAN 10-320 MG PO TABS: 1.0000 | ORAL_TABLET | Freq: Every day | ORAL | Status: DC

## 2012-11-11 MED ORDER — METFORMIN HCL 1000 MG PO TABS: 1000.0000 mg | ORAL_TABLET | Freq: Two times a day (BID) | ORAL | Status: DC

## 2012-11-11 MED ORDER — NYSTATIN 100000 UNIT/GM EX CREA: TOPICAL_CREAM | CUTANEOUS | Status: DC

## 2012-11-11 MED ORDER — POTASSIUM CHLORIDE CRYS ER 20 MEQ PO TBCR: 20.0000 meq | EXTENDED_RELEASE_TABLET | Freq: Every day | ORAL | Status: DC

## 2012-11-11 NOTE — Progress Notes (Signed)
Patient ID: Johnny Smith, male   DOB: 08-04-1950, 62 y.o.   MRN: [id number]  CC: Followup  HPI: 62 year old male with past medical history of diabetes, hypertension, GERD and dyslipidemia who presented to clinic for followup. Patient has no current complaints. No chest pain or shortness of breath. No abdominal pain. No fevers. No constipation or diarrhea.  No Known Allergies Past Medical History  Diagnosis Date  . Diabetes mellitus without complication   . Hypertension   . GERD (gastroesophageal reflux disease)   . High cholesterol    No current outpatient prescriptions on file prior to visit.   No current facility-administered medications on file prior to visit.   Family History  Problem Relation Age of Onset  . Stroke Mother   . Heart attack Father    History   Social History  . Marital Status: Single    Spouse Name: N/A    Number of Children: N/A  . Years of Education: N/A   Occupational History  . Not on file.   Social History Main Topics  . Smoking status: Never Smoker   . Smokeless tobacco: Not on file  . Alcohol Use: No  . Drug Use: No  . Sexual Activity: Not on file   Other Topics Concern  . Not on file   Social History Narrative  . No narrative on file    Review of Systems  Constitutional: Negative for fever, chills, diaphoresis, activity change, appetite change and fatigue.  HENT: Negative for ear pain, nosebleeds, congestion, facial swelling, rhinorrhea, neck pain, neck stiffness and ear discharge.   Eyes: Negative for pain, discharge, redness, itching and visual disturbance.  Respiratory: Negative for cough, choking, chest tightness, shortness of breath, wheezing and stridor.   Cardiovascular: Negative for chest pain, palpitations and leg swelling.  Gastrointestinal: Negative for abdominal distention.  Genitourinary: Negative for dysuria, urgency, frequency, hematuria, flank pain, decreased urine volume, difficulty urinating and dyspareunia.   Musculoskeletal: Negative for back pain, joint swelling, arthralgias and gait problem.  Neurological: Negative for dizziness, tremors, seizures, syncope, facial asymmetry, speech difficulty, weakness, light-headedness, numbness and headaches.  Hematological: Negative for adenopathy. Does not bruise/bleed easily.  Psychiatric/Behavioral: Negative for hallucinations, behavioral problems, confusion, dysphoric mood, decreased concentration and agitation.    Objective:   Filed Vitals:   11/11/12 1030  BP: 152/84  Pulse: 78  Temp: 97.9 F (36.6 C)  Resp: 16    Physical Exam  Constitutional: Appears well-developed and well-nourished. No distress.  HENT: Normocephalic. External right and left ear normal. Oropharynx is clear and moist.  Eyes: Conjunctivae and EOM are normal. PERRLA, no scleral icterus.  Neck: Normal ROM. Neck supple. No JVD. No tracheal deviation. No thyromegaly.  CVS: RRR, S1/S2 +, no murmurs, no gallops, no carotid bruit.  Pulmonary: Effort and breath sounds normal, no stridor, rhonchi, wheezes, rales.  Abdominal: Soft. BS +,  no distension, tenderness, rebound or guarding.  Musculoskeletal: Normal range of motion. No edema and no tenderness.  Lymphadenopathy: No lymphadenopathy noted, cervical, inguinal. Neuro: Alert. Normal reflexes, muscle tone coordination. No cranial nerve deficit. Skin: Skin is warm and dry. No rash noted. Not diaphoretic. No erythema. No pallor.  Psychiatric: Normal mood and affect. Behavior, judgment, thought content normal.   Lab Results  Component Value Date   WBC 4.9 07/16/2012   HGB 13.4 07/16/2012   HCT 40.4 07/16/2012   MCV 88.8 07/16/2012   PLT 330 07/16/2012   Lab Results  Component Value Date   CREATININE 1.61* 07/16/2012  BUN 16 07/16/2012   NA 133* 07/16/2012   K 4.8 07/16/2012   CL 100 07/16/2012   CO2 25 07/16/2012    Lab Results  Component Value Date   HGBA1C 7.3 11/11/2012   Lipid Panel     Component Value Date/Time    CHOL 140 07/16/2012 1801   TRIG 122 07/16/2012 1801   HDL 28* 07/16/2012 1801   CHOLHDL 5.0 07/16/2012 1801   VLDL 24 07/16/2012 1801   LDLCALC 88 07/16/2012 1801       Assessment and plan:   Patient Active Problem List   Diagnosis Date Noted  . HTN (hypertension) 11/11/2012    Priority: Medium - We have discussed target BP range - I have advised pt to check BP regularly and to call us back if the numbers are higher than 140/90 - discussed the importance of compliance with medical therapy and diet  - did not take am meds - continue current BP meds  . Type 2 diabetes mellitus 07/17/2012    Priority: Medium - A1c 7.3 today; may continue glipizide and januvia - no metformin as his Cr on last blood work was 1.61 - recheck BMP today  . Unspecified hypothyroidism 07/16/2012    Priority: Medium - check TSH - Continue Synthroid   . Dyslipidemia 07/16/2012    Priority: Medium - Continue Crestor 30 mg daily

## 2012-11-11 NOTE — Progress Notes (Signed)
Patient here for follow up on DM States this visit he is supposed to be put On metformin

## 2012-11-11 NOTE — Patient Instructions (Signed)

## 2014-02-10 ENCOUNTER — Emergency Department (INDEPENDENT_AMBULATORY_CARE_PROVIDER_SITE_OTHER): Payer: No Typology Code available for payment source

## 2014-02-10 ENCOUNTER — Emergency Department (HOSPITAL_COMMUNITY)
Admission: EM | Admit: 2014-02-10 | Discharge: 2014-02-10 | Disposition: A | Discharge status: Home / Self Care | Payer: No Typology Code available for payment source | Source: Home / Self Care | Attending: Family Medicine | Admitting: Family Medicine

## 2014-02-10 ENCOUNTER — Encounter (HOSPITAL_COMMUNITY): Payer: Self-pay | Admitting: Emergency Medicine

## 2014-02-10 DIAGNOSIS — R1032 Left lower quadrant pain: Secondary | ICD-10-CM

## 2014-02-10 DIAGNOSIS — K5901 Slow transit constipation: Secondary | ICD-10-CM

## 2014-02-10 LAB — POCT URINALYSIS DIP (DEVICE)
Bilirubin Urine: NEGATIVE
Glucose, UA: NEGATIVE mg/dL
Hgb urine dipstick: NEGATIVE
KETONES UR: NEGATIVE mg/dL
LEUKOCYTES UA: NEGATIVE
Nitrite: NEGATIVE
Protein, ur: NEGATIVE mg/dL
Specific Gravity, Urine: 1.02 (ref 1.005–1.030)
Urobilinogen, UA: 2 mg/dL — ABNORMAL HIGH (ref 0.0–1.0)
pH: 7 (ref 5.0–8.0)

## 2014-02-10 NOTE — Discharge Instructions (Signed)
Abdominal Pain Many things can cause abdominal pain. Usually, abdominal pain is not caused by a disease and will improve without treatment. It can often be observed and treated at home. Your health care provider will do a physical exam and possibly order blood tests and X-rays to help determine the seriousness of your pain. However, in many cases, more time must pass before a clear cause of the pain can be found. Before that point, your health care provider may not know if you need more testing or further treatment. HOME CARE INSTRUCTIONS  Monitor your abdominal pain for any changes. The following actions may help to alleviate any discomfort you are experiencing:  Only take over-the-counter or prescription medicines as directed by your health care provider.  Do not take laxatives unless directed to do so by your health care provider.  Try a clear liquid diet (broth, tea, or water) as directed by your health care provider. Slowly move to a bland diet as tolerated. SEEK MEDICAL CARE IF:  You have unexplained abdominal pain.  You have abdominal pain associated with nausea or diarrhea.  You have pain when you urinate or have a bowel movement.  You experience abdominal pain that wakes you in the night.  You have abdominal pain that is worsened or improved by eating food.  You have abdominal pain that is worsened with eating fatty foods.  You have a fever. SEEK IMMEDIATE MEDICAL CARE IF:   Your pain does not go away within 2 hours.  You keep throwing up (vomiting).  Your pain is felt only in portions of the abdomen, such as the right side or the left lower portion of the abdomen.  You pass bloody or black tarry stools. MAKE SURE YOU:  Understand these instructions.   Will watch your condition.   Will get help right away if you are not doing well or get worse.  Document Released: 09/27/2004 Document Revised: 12/23/2012 Document Reviewed: 08/27/2012 [facility] Patient Information  2015 [place], [place]. This information is not intended to replace advice given to you by your health care provider. Make sure you discuss any questions you have with your health care provider.  Constipation Miralax with plenty of liquid Stool softeners Constipation is when a person:  Poops (has a bowel movement) less than 3 times a week.  Has a hard time pooping.  Has poop that is dry, hard, or bigger than normal. HOME CARE   Eat foods with a lot of fiber in them. This includes fruits, vegetables, beans, and whole grains such as brown rice.  Avoid fatty foods and foods with a lot of sugar. This includes french fries, hamburgers, cookies, candy, and soda.  If you are not getting enough fiber from food, take products with added fiber in them (supplements).  Drink enough fluid to keep your pee (urine) clear or pale yellow.  Exercise on a regular basis, or as told by your doctor.  Go to the restroom when you feel like you need to poop. Do not hold it.  Only take medicine as told by your doctor. Do not take medicines that help you poop (laxatives) without talking to your doctor first. GET HELP RIGHT AWAY IF:   You have bright red blood in your poop (stool).  Your constipation lasts more than 4 days or gets worse.  You have belly (abdominal) or butt (rectal) pain.  You have thin poop (as thin as a pencil).  You lose weight, and it cannot be explained. MAKE SURE YOU:  Understand these instructions.  Will watch your condition.  Will get help right away if you are not doing well or get worse. Document Released: 06/06/2007 Document Revised: 12/23/2012 Document Reviewed: 09/29/2012 [facility] Patient Information 2015 [place], [place]. This information is not intended to replace advice given to you by your health care provider. Make sure you discuss any questions you have with your health care provider.

## 2014-02-10 NOTE — ED Notes (Signed)
C/o back pain onset Tuesday Sx urinary urgency Denies dysuria, hematuria, fevers, chills Alert, no signs of acute distress.

## 2014-02-10 NOTE — ED Provider Notes (Signed)
CSN: [id number]     Arrival date & time 02/10/14  1627 History   First MD Initiated Contact with Patient 02/10/14 1724     Chief Complaint  Patient presents with  . Back Pain   (Consider location/radiation/quality/duration/timing/severity/associated sxs/prior Treatment) HPI Comments: 64 year old male with a chief complaint of "my kidneys are sore" pointing to his left lower abdomen. This began approximate 48 hours ago. One day prior to that he developed diarrhea. He has loose stools 2-3 times per day. No bleeding. Denies urinary symptoms. Denies back pain.   Past Medical History  Diagnosis Date  . Diabetes mellitus without complication   . Hypertension   . GERD (gastroesophageal reflux disease)   . High cholesterol    Past Surgical History  Procedure Laterality Date  . Appendectomy  1991  . Colonoscopy  2005    POLYP REMOVED   Family History  Problem Relation Age of Onset  . Stroke Mother   . Heart attack Father    History  Substance Use Topics  . Smoking status: Never Smoker   . Smokeless tobacco: Not on file  . Alcohol Use: No    Review of Systems  Constitutional: Negative for fever, activity change and fatigue.  HENT: Positive for congestion, postnasal drip and rhinorrhea.   Respiratory: Negative for cough, shortness of breath and wheezing.   Cardiovascular: Negative for chest pain, palpitations and leg swelling.  Gastrointestinal: Positive for abdominal pain and diarrhea. Negative for nausea, vomiting and blood in stool.  Genitourinary: Positive for decreased urine volume. Negative for dysuria, urgency, frequency, hematuria, discharge, scrotal swelling and testicular pain.  Musculoskeletal: Negative for myalgias, back pain, gait problem and neck pain.  Skin: Negative.   Neurological: Negative.     Allergies  Review of patient's allergies indicates no known allergies.  Home Medications   Prior to Admission medications   Medication Sig Start Date End Date  Taking? Authorizing Provider  amLODipine-valsartan (EXFORGE) 10-320 MG per tablet Take 1 tablet by mouth daily. 11/11/12  Yes Robbie Lis, MD  fenofibrate (TRICOR) 145 MG tablet Take 1 tablet (145 mg total) by mouth daily. 11/11/12  Yes Robbie Lis, MD  glipiZIDE (GLUCOTROL) 5 MG tablet Take 1 tablet (5 mg total) by mouth 2 (two) times daily before a meal. 11/11/12  Yes Robbie Lis, MD  levothyroxine (SYNTHROID, LEVOTHROID) 112 MCG tablet Take 1 tablet (112 mcg total) by mouth daily before breakfast. 11/11/12  Yes Robbie Lis, MD  metoprolol succinate (TOPROL-XL) 100 MG 24 hr tablet Take 1 tablet (100 mg total) by mouth daily. Take with or immediately following a meal. 11/11/12  Yes Robbie Lis, MD  potassium chloride SA (K-DUR,KLOR-CON) 20 MEQ tablet Take 1 tablet (20 mEq total) by mouth daily. 11/11/12  Yes Robbie Lis, MD  rosuvastatin (CRESTOR) 10 MG tablet Take 1 tablet (10 mg total) by mouth daily. 11/11/12  Yes Robbie Lis, MD  esomeprazole (NEXIUM) 40 MG capsule Take 1 capsule (40 mg total) by mouth daily before breakfast. 11/11/12   Robbie Lis, MD  nystatin cream (MYCOSTATIN) Apply to affected area 2 times daily 11/11/12   Robbie Lis, MD  rosuvastatin (CRESTOR) 20 MG tablet Take 1 tablet (20 mg total) by mouth daily. 11/11/12   Robbie Lis, MD  sitaGLIPtin (JANUVIA) 100 MG tablet Take 1 tablet (100 mg total) by mouth daily. 11/11/12   Robbie Lis, MD   BP 141/98 mmHg  Pulse 75  Temp(Src) 97.9  F (36.6 C) (Oral)  Resp 20  SpO2 95% Physical Exam  Constitutional: He is oriented to person, place, and time. He appears well-developed and well-nourished. No distress.  HENT:  Head: Normocephalic and atraumatic.  Eyes: Conjunctivae and EOM are normal.  Neck: Normal range of motion. Neck supple.  Cardiovascular: Normal rate, regular rhythm and normal heart sounds.   Pulmonary/Chest: Effort normal and breath sounds normal. No respiratory distress. He has no wheezes.   Abdominal: Soft. Bowel sounds are normal. He exhibits distension. He exhibits no mass. There is no rebound and no guarding.  Mild tenderness to the left lower quadrant. There is a diagonal scar to the right lower quadrant from remote appendectomy. The abdomen is mildly distended and percusses tympanic.  Musculoskeletal: Normal range of motion. He exhibits no edema or tenderness.  Neurological: He is alert and oriented to person, place, and time. No cranial nerve deficit.  Skin: Skin is warm and dry. No rash noted.  Psychiatric: He has a normal mood and affect.  Nursing note and vitals reviewed.   ED Course  Procedures (including critical care time) Labs Review Labs Reviewed  POCT URINALYSIS DIP (DEVICE) - Abnormal; Notable for the following:    Urobilinogen, UA 2.0 (*)    All other components within normal limits   Results for orders placed or performed during the hospital encounter of 02/10/14  POCT urinalysis dip (device)  Result Value Ref Range   Glucose, UA NEGATIVE NEGATIVE mg/dL   Bilirubin Urine NEGATIVE NEGATIVE   Ketones, ur NEGATIVE NEGATIVE mg/dL   Specific Gravity, Urine 1.020 1.005 - 1.030   Hgb urine dipstick NEGATIVE NEGATIVE   pH 7.0 5.0 - 8.0   Protein, ur NEGATIVE NEGATIVE mg/dL   Urobilinogen, UA 2.0 (H) 0.0 - 1.0 mg/dL   Nitrite NEGATIVE NEGATIVE   Leukocytes, UA NEGATIVE NEGATIVE    Imaging Review Dg Abd 1 View  02/10/2014   CLINICAL DATA:  Left lower quadrant pain and back pain.  EXAM: ABDOMEN - 1 VIEW  COMPARISON:  None.  FINDINGS: Bowel gas pattern is within normal limits. No evidence of obstruction or ileus. No abnormal calcifications or bony abnormalities. No soft tissue abnormalities are seen.  IMPRESSION: Normal abdominal film.   Electronically Signed   By: Aletta Edouard M.D.   On: 02/10/2014 18:54     MDM   1. Left lower quadrant pain   2. Slow transit constipation    Miralax with plenty of liquid Stool softeners Read instructions Go to  the ED if worse or new sx's Liquids next 24 hours    Janne Napoleon, NP 02/10/14 1902

## 2014-10-28 DIAGNOSIS — C61 Malignant neoplasm of prostate: Secondary | ICD-10-CM

## 2014-10-28 HISTORY — PX: PROSTATE BIOPSY: SHX241

## 2014-10-28 HISTORY — DX: Malignant neoplasm of prostate: C61

## 2014-11-15 NOTE — Progress Notes (Signed)
GU Location of Tumor / Histology: Adenocarcinoma of the Prostate  If Prostate Cancer, Gleason Score is ( + ) and PSA is (6.21)  Johnny Smith presented with an elevated PSA of 4.94 to Dr. Festus Aloe on 10/28/14  Biopsies of PROSTATE (if applicable) revealed: Adenocarcinoma  Left Base Lateral, Gleason Score 3+4=7  Left Mid Lateral, Gleason Score 3+3=6  Right Base Lateral, Gleason Score 3+3=6  Right Mid Lateral, Gleason Score 3+3=6  Past/Anticipated interventions by urology, if any: Biopsy of the Prostate  Past/Anticipated interventions by medical oncology, if any: None  Weight changes, if any: no  Bowel/Bladder complaints, if any: "typically voids with a good stream and has had no dysuria or gross hematuria - IPSS score of 1  Nausea/Vomiting, if any: no  Pain issues, if any:  no  SAFETY ISSUES:  Prior radiation? no  Pacemaker/ICD? no  Possible current pregnancy? N/A  Is the patient on methotrexate? No  Current Complaints / other details: Patient is here with his brother.  BP 134/85 mmHg  Pulse 87  Temp(Src) 98.5 F (36.9 C) (Oral)  Resp 18  Ht 5\' 11"  (1.803 m)  Wt 179 lb 6.4 oz (81.375 kg)  BMI 25.03 kg/m2

## 2014-11-16 ENCOUNTER — Encounter: Payer: Self-pay | Admitting: Radiation Oncology

## 2014-11-17 ENCOUNTER — Ambulatory Visit
Admission: RE | Admit: 2014-11-17 | Discharge: 2014-11-17 | Disposition: A | Discharge status: Home / Self Care | Payer: No Typology Code available for payment source | Source: Ambulatory Visit | Attending: Radiation Oncology | Admitting: Radiation Oncology

## 2014-11-17 ENCOUNTER — Encounter: Payer: Self-pay | Admitting: Radiation Oncology

## 2014-11-17 VITALS — BP 134/85 | HR 87 | Temp 98.5°F | Resp 18 | Ht 71.0 in | Wt 179.4 lb

## 2014-11-17 DIAGNOSIS — R9721 Rising PSA following treatment for malignant neoplasm of prostate: Secondary | ICD-10-CM | POA: Diagnosis not present

## 2014-11-17 DIAGNOSIS — C61 Malignant neoplasm of prostate: Secondary | ICD-10-CM | POA: Insufficient documentation

## 2014-11-17 HISTORY — DX: Disorder of kidney and ureter, unspecified: N28.9

## 2014-11-17 HISTORY — DX: Malignant neoplasm of prostate: C61

## 2014-11-17 HISTORY — DX: Chronic kidney disease, unspecified: N18.9

## 2014-11-17 NOTE — Addendum Note (Signed)
Encounter addended by: Benn Moulder, RN on: 11/17/2014 10:15 AM<BR>     Documentation filed: Visit Diagnoses

## 2014-11-17 NOTE — Progress Notes (Signed)
Please see the Nurse Progress Note in the MD Initial Consult Encounter for this patient. 

## 2014-11-17 NOTE — Progress Notes (Signed)
[facility] NEW PATIENT EVALUATION  Name: Johnny Smith MRN: [id number]  Date:   11/17/2014           DOB: 03/07/1950  Status: outpatient   CC: Angelica Chessman, MD  Festus Aloe, MD , Dr. Dutch Gray, Dr. 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 male who is seen today through the courtesy of Dr. 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  He has had a rising PSA over the past few years.  His PSA was 3.6 in June 2014, rising to 8.06 by June 2016 with a 9% free PSA.  His PSA did fall to 6.21 on 08/26/2014, but again with a 9% free PSA.  He underwent ultrasound-guided biopsies by Dr. Junious Silk on 10/28/2014.  He was found have Gleason 7 (3+4) involving 60% of one core (pattern 4 equals 40%) from the left lateral base.  Perineural invasion was identified.  He also had Gleason 6 (3+3) involving less than 5% of one core from the left lateral mid gland, 5% of one core from the right lateral base and less than 5% of one core from the right lateral mid gland.  His gland volume was 36 mL.  He is doing well from a GU and GI standpoint.  His I PSS score is 1.  He does have erectile dysfunction and is not sexually active.  He plans to see Dr. Alinda Money for discussion of robotic surgery on December 13.  PREVIOUS RADIATION THERAPY: No   PAST MEDICAL HISTORY:  has a past medical history of Diabetes mellitus without complication ([place]); Hypertension; GERD (gastroesophageal reflux disease); High cholesterol; Prostate cancer ([place]) (10/28/2014); and Chronic renal insufficiency.     PAST SURGICAL HISTORY:  Past Surgical History  Procedure Laterality Date  . Appendectomy  1991  . Colonoscopy  2005    POLYP REMOVED  . Prostate biopsy  10/28/14     FAMILY HISTORY: family history includes  Heart attack in his father; Prostate cancer in his maternal uncle; Stroke in his mother; Uterine cancer in his paternal grandmother.  His mother died following a stroke at 78.  His father died from cardiac disease at 65.  He had 2 maternal uncles were diagnosed with prostate cancer (question diagnosed in their 73s).  His paternal grandfather was also diagnosed in his 84s and died from other causes in his early 77s.   SOCIAL HISTORY:  reports that he has never smoked. He does not have any smokeless tobacco history on file. He reports that he does not drink alcohol or use illicit drugs. Single, no children.  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  Spent time in [place] during his youth.  He has a brother who works at the [employer] in the [place], and he provides transportation.   ALLERGIES: Review of patient's allergies indicates no known allergies.   MEDICATIONS:  Current Outpatient Prescriptions  Medication Sig Dispense Refill  . amLODipine-valsartan (EXFORGE) 10-320 MG per tablet Take 1 tablet by mouth daily. 30 tablet 3  . esomeprazole (NEXIUM) 40 MG capsule Take 1 capsule (40 mg total) by mouth daily before breakfast. 30 capsule 3  . glipiZIDE (GLUCOTROL) 5 MG tablet Take 1 tablet (5 mg total) by mouth 2 (two) times daily before a meal. 30 tablet 3  .  levothyroxine (SYNTHROID, LEVOTHROID) 112 MCG tablet Take 1 tablet (112 mcg total) by mouth daily before breakfast. 30 tablet 3  . metoprolol succinate (TOPROL-XL) 100 MG 24 hr tablet Take 1 tablet (100 mg total) by mouth daily. Take with or immediately following a meal. 30 tablet 3  . nystatin cream (MYCOSTATIN) Apply to affected area 2 times daily 30 g 3  . potassium chloride SA (K-DUR,KLOR-CON) 20 MEQ tablet Take 1 tablet (20 mEq total) by mouth daily. 30 tablet 3  . sitaGLIPtin (JANUVIA) 100 MG tablet Take 1 tablet (100 mg total) by mouth daily. 30 tablet 3  . fenofibrate (TRICOR) 145 MG  tablet Take 1 tablet (145 mg total) by mouth daily. (Patient not taking: Reported on 11/17/2014) 30 tablet 3  . rosuvastatin (CRESTOR) 10 MG tablet Take 1 tablet (10 mg total) by mouth daily. (Patient not taking: Reported on 11/17/2014) 30 tablet 3  . rosuvastatin (CRESTOR) 20 MG tablet Take 1 tablet (20 mg total) by mouth daily. (Patient not taking: Reported on 11/17/2014) 30 tablet 3   No current facility-administered medications for this encounter.     REVIEW OF SYSTEMS:  Pertinent items are noted in HPI.    PHYSICAL EXAM:  height is 5\' 11"  (1.803 m) and weight is 179 lb 6.4 oz (81.375 kg). His oral temperature is 98.5 F (36.9 C). His blood pressure is 134/85 and his pulse is 87. His respiration is 18.   Alert and oriented black male appearing younger than his stated age.  Rectal examination: The prostate gland is normal in size and is without focal induration or nodularity.   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  I explained to the patient and his brother that his prognosis is related to his stage, Gleason score, and PSA level.  His stage and PSA level are favorable, however his Gleason score 7 is of intermediate favorability.  Other prognostic factors include disease volume and PSA doubling time.  We discussed surgery, active surveillance, and radiation therapy.  In view of his relatively young age and a Gleason score of 7, I do not recommend active surveillance.  Radiation therapy options include seed implant alone, 5  weeks of external beam followed by seed implant boost, or external beam/IMRT.  His [facility] nomogram indicates the risk for extracapsular extension to be 53%, with a low risk for lymph node or seminal vesicle involvement (3% for both).  The degree of extracapsular extension is typically less than 2-3  mm in better than 80% of patients and this is typically cleared by surgery or external beam.  Therefore, I would not be entirely comfortable with seed implantation alone.  If he wants a seed implant, I would favor 5 weeks of external beam initially.  We discussed the potential acute and late toxicities of both seed implantation and external beam/IMRT.  We also discussed bladder filling to minimize urinary toxicity.  He will meet  with Dr. Alinda Money on December 13 to discuss robotic surgery.  If he then decides to proceed with radiation therapy, we will need to have placement of 3 gold seed markers for image guidance.  I will be retired by late December, and Dr. Tammi Klippel would assume his simulation/treatment planning/care.  He will need a follow-up visit with Dr. Tammi Klippel should he decide on radiation therapy.   PLAN: As discussed above.   I spent 45  minutes face to face with the patient and more than 50% of that time was spent in counseling and/or coordination of care.

## 2014-12-16 ENCOUNTER — Other Ambulatory Visit: Payer: Self-pay | Admitting: Urology

## 2015-03-04 ENCOUNTER — Encounter (HOSPITAL_COMMUNITY): Payer: Self-pay

## 2015-03-04 ENCOUNTER — Encounter (HOSPITAL_COMMUNITY)
Admission: RE | Admit: 2015-03-04 | Discharge: 2015-03-04 | Disposition: A | Discharge status: Home / Self Care | Payer: BLUE CROSS/BLUE SHIELD | Source: Ambulatory Visit | Attending: Urology | Admitting: Urology

## 2015-03-04 ENCOUNTER — Ambulatory Visit (HOSPITAL_COMMUNITY)
Admission: RE | Admit: 2015-03-04 | Discharge: 2015-03-04 | Disposition: A | Discharge status: Home / Self Care | Payer: BLUE CROSS/BLUE SHIELD | Source: Ambulatory Visit | Attending: Anesthesiology | Admitting: Anesthesiology

## 2015-03-04 DIAGNOSIS — Z01812 Encounter for preprocedural laboratory examination: Secondary | ICD-10-CM | POA: Insufficient documentation

## 2015-03-04 DIAGNOSIS — Z01818 Encounter for other preprocedural examination: Secondary | ICD-10-CM

## 2015-03-04 HISTORY — DX: Pneumonia, unspecified organism: J18.9

## 2015-03-04 HISTORY — DX: Hypothyroidism, unspecified: E03.9

## 2015-03-04 LAB — BASIC METABOLIC PANEL
ANION GAP: 7 (ref 5–15)
BUN: 13 mg/dL (ref 6–20)
CHLORIDE: 107 mmol/L (ref 101–111)
CO2: 25 mmol/L (ref 22–32)
Calcium: 9.3 mg/dL (ref 8.9–10.3)
Creatinine, Ser: 0.9 mg/dL (ref 0.61–1.24)
GFR calc non Af Amer: >60 mL/min (ref >60)
Glucose, Bld: 140 mg/dL — ABNORMAL HIGH (ref 65–99)
Potassium: 3.9 mmol/L (ref 3.5–5.1)
Sodium: 139 mmol/L (ref 135–145)

## 2015-03-04 LAB — CBC
HCT: 41.6 % (ref 39.0–52.0)
HEMOGLOBIN: 13.7 g/dL (ref 13.0–17.0)
MCH: 30.6 pg (ref 26.0–34.0)
MCHC: 32.9 g/dL (ref 30.0–36.0)
MCV: 92.9 fL (ref 78.0–100.0)
Platelets: 236 10*3/uL (ref 150–400)
RBC: 4.48 MIL/uL (ref 4.22–5.81)
RDW: 14 % (ref 11.5–15.5)
WBC: 5.1 10*3/uL (ref 4.0–10.5)

## 2015-03-04 LAB — ABO/RH: ABO/RH(D): O POS

## 2015-03-04 NOTE — Pre-Procedure Instructions (Addendum)
Pt is on Amlodipine-Valsartan combo at home.  Put in order for Amlodipine 10mg  for day of surgery. Pt stated Dr. Lynne Logan office instructed him to administer Fleets enema the night before surgery.  I instructed pt to follow their orders.

## 2015-03-04 NOTE — Patient Instructions (Addendum)
Webster Liera  03/04/2015   Your procedure is scheduled on: Thursday 03-10-15  Report to [facility]  Entrance take [facility]  elevators to 3rd floor to  [place] at 5:15AM.  Call this number if you have problems the morning of surgery [phone]   Remember: ONLY 1 PERSON MAY GO WITH YOU TO SHORT STAY TO GET  READY MORNING OF Johnny Smith.  Do not eat food or drink liquids :After Midnight.     Take these medicines the morning of surgery with A SIP OF WATER: Levothyroxine (Synthroid), Simvastatin, Prilosec DO NOT TAKE ANY DIABETIC MEDICATIONS DAY OF YOUR SURGERY                               You may not have any metal on your body including hair pins and              piercings  Do not wear jewelry, make-up, lotions, powders or perfumes, deodorant             Do not wear nail polish.  Do not shave  48 hours prior to surgery.              Men may shave face and neck.   Do not bring valuables to the hospital. [place].  Contacts, dentures or bridgework may not be worn into surgery.  Leave suitcase in the car. After surgery it may be brought to your room.      Special Instructions: N/A              Please read over the following fact sheets you were given: _____________________________________________________________________             [facility] - Preparing for Surgery Before surgery, you can play an important role.  Because skin is not sterile, your skin needs to be as free of germs as possible.  You can reduce the number of germs on your skin by washing with CHG (chlorahexidine gluconate) soap before surgery.  CHG is an antiseptic cleaner which kills germs and bonds with the skin to continue killing germs even after washing. Please DO NOT use if you have an allergy to CHG or antibacterial soaps.  If your skin becomes reddened/irritated stop using the CHG and inform your nurse when you arrive at  Short Stay. Do not shave (including legs and underarms) for at least 48 hours prior to the first CHG shower.  You may shave your face/neck. Please follow these instructions carefully:  1.  Shower with CHG Soap the night before surgery and the  morning of Surgery.  2.  If you choose to wash your hair, wash your hair first as usual with your  normal  shampoo.  3.  After you shampoo, rinse your hair and body thoroughly to remove the  shampoo.                           4.  Use CHG as you would any other liquid soap.  You can apply chg directly  to the skin and wash  Gently with a scrungie or clean washcloth.  5.  Apply the CHG Soap to your body ONLY FROM THE NECK DOWN.   Do not use on face/ open                           Wound or open sores. Avoid contact with eyes, ears mouth and genitals (private parts).                       Wash face,  Genitals (private parts) with your normal soap.             6.  Wash thoroughly, paying special attention to the area where your surgery  will be performed.  7.  Thoroughly rinse your body with warm water from the neck down.  8.  DO NOT shower/wash with your normal soap after using and rinsing off  the CHG Soap.                9.  Pat yourself dry with a clean towel.            10.  Wear clean pajamas.            11.  Place clean sheets on your bed the night of your first shower and do not  sleep with pets. Day of Surgery : Do not apply any lotions/deodorants the morning of surgery.  Please wear clean clothes to the hospital/surgery center.  FAILURE TO FOLLOW THESE INSTRUCTIONS MAY RESULT IN THE CANCELLATION OF YOUR SURGERY PATIENT SIGNATURE_________________________________  NURSE SIGNATURE__________________________________  ________________________________________________________________________   Adam Phenix  An incentive spirometer is a tool that can help keep your lungs clear and active. This tool measures how well you are  filling your lungs with each breath. Taking long deep breaths may help reverse or decrease the chance of developing breathing (pulmonary) problems (especially infection) following:  A long period of time when you are unable to move or be active. BEFORE THE PROCEDURE   If the spirometer includes an indicator to show your best effort, your nurse or respiratory therapist will set it to a desired goal.  If possible, sit up straight or lean slightly forward. Try not to slouch.  Hold the incentive spirometer in an upright position. INSTRUCTIONS FOR USE   Sit on the edge of your bed if possible, or sit up as far as you can in bed or on a chair.  Hold the incentive spirometer in an upright position.  Breathe out normally.  Place the mouthpiece in your mouth and seal your lips tightly around it.  Breathe in slowly and as deeply as possible, raising the piston or the ball toward the top of the column.  Hold your breath for 3-5 seconds or for as long as possible. Allow the piston or ball to fall to the bottom of the column.  Remove the mouthpiece from your mouth and breathe out normally.  Rest for a few seconds and repeat Steps 1 through 7 at least 10 times every 1-2 hours when you are awake. Take your time and take a few normal breaths between deep breaths.  The spirometer may include an indicator to show your best effort. Use the indicator as a goal to work toward during each repetition.  After each set of 10 deep breaths, practice coughing to be sure your lungs are clear. If you have an incision (the cut made at the time of surgery),  support your incision when coughing by placing a pillow or rolled up towels firmly against it. Once you are able to get out of bed, walk around indoors and cough well. You may stop using the incentive spirometer when instructed by your caregiver.  RISKS AND COMPLICATIONS  Take your time so you do not get dizzy or light-headed.  If you are in pain, you may  need to take or ask for pain medication before doing incentive spirometry. It is harder to take a deep breath if you are having pain. AFTER USE  Rest and breathe slowly and easily.  It can be helpful to keep track of a log of your progress. Your caregiver can provide you with a simple table to help with this. If you are using the spirometer at home, follow these instructions: [place] IF:   You are having difficultly using the spirometer.  You have trouble using the spirometer as often as instructed.  Your pain medication is not giving enough relief while using the spirometer.  You develop fever of 100.5 F (38.1 C) or higher. SEEK IMMEDIATE MEDICAL CARE IF:   You cough up bloody sputum that had not been present before.  You develop fever of 102 F (38.9 C) or greater.  You develop worsening pain at or near the incision site. MAKE SURE YOU:   Understand these instructions.  Will watch your condition.  Will get help right away if you are not doing well or get worse. Document Released: 04/30/2006 Document Revised: 03/12/2011 Document Reviewed: 07/01/2006 ExitCare Patient Information 2014 ExitCare, [place].   ________________________________________________________________________  WHAT IS A BLOOD TRANSFUSION? Blood Transfusion Information  A transfusion is the replacement of blood or some of its parts. Blood is made up of multiple cells which provide different functions.  Red blood cells carry oxygen and are used for blood loss replacement.  White blood cells fight against infection.  Platelets control bleeding.  Plasma helps clot blood.  Other blood products are available for specialized needs, such as hemophilia or other clotting disorders. BEFORE THE TRANSFUSION  Who gives blood for transfusions?   Healthy volunteers who are fully evaluated to make sure their blood is safe. This is blood bank blood. Transfusion therapy is the safest it has ever been in  the practice of medicine. Before blood is taken from a donor, a complete history is taken to make sure that person has no history of diseases nor engages in risky social behavior (examples are intravenous drug use or sexual activity with multiple partners). The donor's travel history is screened to minimize risk of transmitting infections, such as malaria. The donated blood is tested for signs of infectious diseases, such as HIV and hepatitis. The blood is then tested to be sure it is compatible with you in order to minimize the chance of a transfusion reaction. If you or a relative donates blood, this is often done in anticipation of surgery and is not appropriate for emergency situations. It takes many days to process the donated blood. RISKS AND COMPLICATIONS Although transfusion therapy is very safe and saves many lives, the main dangers of transfusion include:   Getting an infectious disease.  Developing a transfusion reaction. This is an allergic reaction to something in the blood you were given. Every precaution is taken to prevent this. The decision to have a blood transfusion has been considered carefully by your caregiver before blood is given. Blood is not given unless the benefits outweigh the risks. AFTER THE TRANSFUSION  Right after receiving a blood transfusion, you will usually feel much better and more energetic. This is especially true if your red blood cells have gotten low (anemic). The transfusion raises the level of the red blood cells which carry oxygen, and this usually causes an energy increase.  The nurse administering the transfusion will monitor you carefully for complications. HOME CARE INSTRUCTIONS  No special instructions are needed after a transfusion. You may find your energy is better. Speak with your caregiver about any limitations on activity for underlying diseases you may have. SEEK MEDICAL CARE IF:   Your condition is not improving after your  transfusion.  You develop redness or irritation at the intravenous (IV) site. SEEK IMMEDIATE MEDICAL CARE IF:  Any of the following symptoms occur over the next 12 hours:  Shaking chills.  You have a temperature by mouth above 102 F (38.9 C), not controlled by medicine.  Chest, back, or muscle pain.  People around you feel you are not acting correctly or are confused.  Shortness of breath or difficulty breathing.  Dizziness and fainting.  You get a rash or develop hives.  You have a decrease in urine output.  Your urine turns a dark color or changes to pink, red, or brown. Any of the following symptoms occur over the next 10 days:  You have a temperature by mouth above 102 F (38.9 C), not controlled by medicine.  Shortness of breath.  Weakness after normal activity.  The white part of the eye turns yellow (jaundice).  You have a decrease in the amount of urine or are urinating less often.  Your urine turns a dark color or changes to pink, red, or brown. Document Released: 12/16/1999 Document Revised: 03/12/2011 Document Reviewed: 08/04/2007 [facility] Patient Information 2014 [place], [place].  _______________________________________________________________________

## 2015-03-05 LAB — HEMOGLOBIN A1C
HEMOGLOBIN A1C: 7.8 % — AB (ref 4.8–5.6)
Mean Plasma Glucose: 177 mg/dL

## 2015-03-09 NOTE — H&P (Signed)
Chief Complaint Prostate Cancer    History of Present Illness Johnny Smith is a 65 year old gentleman who was found to have a persistently elevated PSA of 6.21 prompting a TRUS biopsy of the prostate on 10/28/14. This confirmed Gleason 3+4=7 adenocarcinoma of the prostate with 4 out of 12 biopsy cores positive for malignancy. He has a maternal uncle with a history of prostate cancer. He has been counseled by Dr. Junious Silk and Dr. Valere Dross about his options thus far.    His medical history is significant for diabetes, GERD, hyperlipidemia, hypertension, and hypothyroidism. He lives with his brother who helps to care for him as he does not drive.    TNM stage: cT1c Nx Mx  PSA: 6.21  Gleason score: 3+4=7  Biopsy (10/28/14): 4/12 cores positive -- L lateral mid (< 5%, 3+3=6), L lateral base (60%, 3+4=7), R lateral mid (< 5%, 3+3=6), R lateral base (5%, 3+3=6)  Prostate volume: 35.9 cc    Nomogram  OC disease: 46%  EPE: 53%  SVI: 3%  LNI: 3%  PFS (surgery): 87% at 6 years, 78% at 11 years    Urinary function: He denies significant urinary symptoms. IPSS is 1.  Erectile function: He does have fairly severe erectile dysfunction which has progressed even over the past 6-12 months. SHIM score is 2.   Past Medical History Problems  1. History of diabetes mellitus (Z86.39) 2. History of esophageal reflux (Z87.19) 3. History of hypercholesterolemia (Z86.39) 4. History of hypertension (Z86.79) 5. History of hypothyroidism (Z86.39)  Surgical History Problems  1. History of Appendectomy 2. History of Complete Colonoscopy  Current Meds 1. Exforge 10-320 MG Oral Tablet;  Therapy: (Recorded:06Oct2014) to Recorded 2. GlipiZIDE 10 MG Oral Tablet;  Therapy: (Recorded:25Jan2016) to Recorded 3. Iron 325 (65 Fe) MG Oral Tablet;  Therapy: (Recorded:25Jan2016) to Recorded 4. Januvia 100 MG Oral Tablet;  Therapy: (Recorded:06Oct2014) to Recorded 5. Levothyroxine Sodium 125 MCG Oral  Tablet;  Therapy: (Recorded:06Oct2014) to Recorded 6. MetFORMIN HCl - 1000 MG Oral Tablet;  Therapy: (Recorded:25Jan2016) to Recorded 7. NexIUM 40 MG Oral Capsule Delayed Release;  Therapy: (Recorded:06Oct2014) to Recorded 8. Potassium Chloride Crys ER 20 MEQ Oral Tablet Extended Release;  Therapy: (Recorded:06Oct2014) to Recorded 9. Simvastatin TABS;  Therapy: (Recorded:13Dec2016) to Recorded 10. Toprol XL 100 MG Oral Tablet Extended Release 24 Hour;   Therapy: (Recorded:06Oct2014) to Recorded  Allergies Medication  1. No Known Drug Allergies  Family History Problems  1. Family history of Acute Myocardial Infarction : Father 2. Family history of Death In The Family Father   MI 3. Family history of Death In The Family Mother   stroke 4. Family history of Family Health Status Number Of Children   no children 5. Family history of Stroke Syndrome : Mother  Social History Problems    Denied: History of Alcohol Use (History)   Marital History - Single   Never A Smoker   Occupation:   Organist   Denied: History of Tobacco Use  Review of Systems Constitutional, skin, eye, otolaryngeal, hematologic/lymphatic, cardiovascular, pulmonary, endocrine, musculoskeletal, gastrointestinal, neurological and psychiatric system(s) were reviewed and pertinent findings if present are noted and are otherwise negative.    Vitals  Weight: 185 lb  BMI Calculated: 26.54 BSA Calculated: 2.02   Physical Exam Constitutional: Well nourished and well developed . No acute distress.  ENT:. The ears and nose are normal in appearance.  Neck: The appearance of the neck is normal and no neck mass is present.  Pulmonary: No respiratory distress,  normal respiratory rhythm and effort and clear bilateral breath sounds.  Cardiovascular: Heart rate and rhythm are normal . No peripheral edema.  Abdomen: right lower quadrant incision site(s) well healed. The abdomen is soft and nontender. No masses  are palpated. No CVA tenderness. No hernias are palpable. No hepatosplenomegaly noted.   Assessment Assessed  1. Prostate cancer (C61)    Discussion/Summary 1. Prostate cancer:  He will undergo a bilateral nerve sparing robot-assisted laparoscopic radical prostatectomy and bilateral pelvic lymphadenectomy.

## 2015-03-10 ENCOUNTER — Inpatient Hospital Stay (HOSPITAL_COMMUNITY): Payer: BLUE CROSS/BLUE SHIELD | Admitting: Certified Registered Nurse Anesthetist

## 2015-03-10 ENCOUNTER — Encounter (HOSPITAL_COMMUNITY): Payer: Self-pay | Admitting: *Deleted

## 2015-03-10 ENCOUNTER — Encounter (HOSPITAL_COMMUNITY)
Admission: RE | Disposition: A | Discharge status: Home / Self Care | Payer: Self-pay | Source: Ambulatory Visit | Attending: Urology

## 2015-03-10 ENCOUNTER — Inpatient Hospital Stay (HOSPITAL_COMMUNITY)
Admission: RE | Admit: 2015-03-10 | Discharge: 2015-03-11 | DRG: 708 | Disposition: A | Discharge status: Home / Self Care | Payer: BLUE CROSS/BLUE SHIELD | Source: Ambulatory Visit | Attending: Urology | Admitting: Urology

## 2015-03-10 DIAGNOSIS — E039 Hypothyroidism, unspecified: Secondary | ICD-10-CM | POA: Diagnosis present

## 2015-03-10 DIAGNOSIS — I10 Essential (primary) hypertension: Secondary | ICD-10-CM | POA: Diagnosis present

## 2015-03-10 DIAGNOSIS — C61 Malignant neoplasm of prostate: Secondary | ICD-10-CM | POA: Diagnosis present

## 2015-03-10 DIAGNOSIS — K219 Gastro-esophageal reflux disease without esophagitis: Secondary | ICD-10-CM | POA: Diagnosis present

## 2015-03-10 DIAGNOSIS — E119 Type 2 diabetes mellitus without complications: Secondary | ICD-10-CM | POA: Diagnosis present

## 2015-03-10 DIAGNOSIS — E785 Hyperlipidemia, unspecified: Secondary | ICD-10-CM | POA: Diagnosis present

## 2015-03-10 DIAGNOSIS — E7800 Pure hypercholesterolemia, unspecified: Secondary | ICD-10-CM | POA: Diagnosis present

## 2015-03-10 HISTORY — PX: ROBOT ASSISTED LAPAROSCOPIC RADICAL PROSTATECTOMY: SHX5141

## 2015-03-10 HISTORY — PX: LYMPHADENECTOMY: SHX5960

## 2015-03-10 LAB — TYPE AND SCREEN
ABO/RH(D): O POS
Antibody Screen: NEGATIVE

## 2015-03-10 LAB — GLUCOSE, CAPILLARY
GLUCOSE-CAPILLARY: 177 mg/dL — AB (ref 65–99)
GLUCOSE-CAPILLARY: 208 mg/dL — AB (ref 65–99)
GLUCOSE-CAPILLARY: 243 mg/dL — AB (ref 65–99)
Glucose-Capillary: 220 mg/dL — ABNORMAL HIGH (ref 65–99)

## 2015-03-10 LAB — HEMOGLOBIN AND HEMATOCRIT, BLOOD
HEMATOCRIT: 37.3 % — AB (ref 39.0–52.0)
Hemoglobin: 12.4 g/dL — ABNORMAL LOW (ref 13.0–17.0)

## 2015-03-10 SURGERY — ROBOTIC ASSISTED LAPAROSCOPIC RADICAL PROSTATECTOMY LEVEL 2
Anesthesia: General

## 2015-03-10 MED ORDER — FENTANYL CITRATE (PF) 100 MCG/2ML IJ SOLN
INTRAMUSCULAR | Status: DC | PRN
  Administered 2015-03-10 (×6): 50 ug via INTRAVENOUS

## 2015-03-10 MED ORDER — LACTATED RINGERS IV SOLN
INTRAVENOUS | Status: DC
  Administered 2015-03-10: 13:00:00 via INTRAVENOUS

## 2015-03-10 MED ORDER — SODIUM CHLORIDE 0.9 % IV BOLUS (SEPSIS)
1000.0000 mL | Freq: Once | INTRAVENOUS | Status: AC
  Administered 2015-03-10: 1000 mL via INTRAVENOUS

## 2015-03-10 MED ORDER — DIPHENHYDRAMINE HCL 50 MG/ML IJ SOLN: 12.5000 mg | Freq: Four times a day (QID) | INTRAMUSCULAR | Status: DC | PRN

## 2015-03-10 MED ORDER — LACTATED RINGERS IV SOLN: INTRAVENOUS | Status: DC

## 2015-03-10 MED ORDER — BUPIVACAINE-EPINEPHRINE (PF) 0.25% -1:200000 IJ SOLN
INTRAMUSCULAR | Status: AC
  Filled 2015-03-10: qty 30

## 2015-03-10 MED ORDER — DEXAMETHASONE SODIUM PHOSPHATE 10 MG/ML IJ SOLN
INTRAMUSCULAR | Status: AC
  Filled 2015-03-10: qty 1

## 2015-03-10 MED ORDER — HYDROCODONE-ACETAMINOPHEN 5-325 MG PO TABS: 1.0000 | ORAL_TABLET | Freq: Four times a day (QID) | ORAL | Status: DC | PRN

## 2015-03-10 MED ORDER — MIDAZOLAM HCL 2 MG/2ML IJ SOLN
INTRAMUSCULAR | Status: AC
  Filled 2015-03-10: qty 2

## 2015-03-10 MED ORDER — SODIUM CHLORIDE 0.9 % IJ SOLN
INTRAMUSCULAR | Status: AC
  Filled 2015-03-10: qty 10

## 2015-03-10 MED ORDER — HYDROMORPHONE HCL 1 MG/ML IJ SOLN
0.2500 mg | INTRAMUSCULAR | Status: DC | PRN
  Administered 2015-03-10 (×4): 0.5 mg via INTRAVENOUS

## 2015-03-10 MED ORDER — PROMETHAZINE HCL 25 MG/ML IJ SOLN
6.2500 mg | INTRAMUSCULAR | Status: DC | PRN
  Administered 2015-03-10: 6.25 mg via INTRAVENOUS

## 2015-03-10 MED ORDER — POTASSIUM CHLORIDE IN NACL 20-0.45 MEQ/L-% IV SOLN
INTRAVENOUS | Status: DC
  Administered 2015-03-10 (×2): via INTRAVENOUS
  Filled 2015-03-10 (×9): qty 1000

## 2015-03-10 MED ORDER — SIMVASTATIN 20 MG PO TABS
20.0000 mg | ORAL_TABLET | Freq: Every day | ORAL | Status: DC
Start: 2015-03-10 — End: 2015-03-11
  Administered 2015-03-10 – 2015-03-11 (×2): 20 mg via ORAL
  Filled 2015-03-10 (×2): qty 1

## 2015-03-10 MED ORDER — PANTOPRAZOLE SODIUM 40 MG PO TBEC
40.0000 mg | DELAYED_RELEASE_TABLET | Freq: Every day | ORAL | Status: DC
  Administered 2015-03-10 – 2015-03-11 (×2): 40 mg via ORAL
  Filled 2015-03-10 (×2): qty 1

## 2015-03-10 MED ORDER — METOPROLOL SUCCINATE ER 100 MG PO TB24
100.0000 mg | ORAL_TABLET | Freq: Every day | ORAL | Status: DC
  Administered 2015-03-10: 100 mg via ORAL
  Filled 2015-03-10: qty 1

## 2015-03-10 MED ORDER — BUPIVACAINE-EPINEPHRINE 0.25% -1:200000 IJ SOLN
INTRAMUSCULAR | Status: DC | PRN
  Administered 2015-03-10: 30 mL

## 2015-03-10 MED ORDER — ROCURONIUM BROMIDE 100 MG/10ML IV SOLN
INTRAVENOUS | Status: DC | PRN
  Administered 2015-03-10: 10 mg via INTRAVENOUS
  Administered 2015-03-10: 60 mg via INTRAVENOUS
  Administered 2015-03-10 (×2): 10 mg via INTRAVENOUS

## 2015-03-10 MED ORDER — CEFAZOLIN SODIUM-DEXTROSE 2-3 GM-% IV SOLR
INTRAVENOUS | Status: AC
  Filled 2015-03-10: qty 50

## 2015-03-10 MED ORDER — MORPHINE SULFATE (PF) 2 MG/ML IV SOLN: 2.0000 mg | INTRAVENOUS | Status: DC | PRN

## 2015-03-10 MED ORDER — AMLODIPINE BESYLATE 10 MG PO TABS
10.0000 mg | ORAL_TABLET | Freq: Once | ORAL | Status: AC
  Administered 2015-03-10: 10 mg via ORAL
  Filled 2015-03-10: qty 1

## 2015-03-10 MED ORDER — HYDROMORPHONE HCL 2 MG/ML IJ SOLN
INTRAMUSCULAR | Status: AC
  Filled 2015-03-10: qty 1

## 2015-03-10 MED ORDER — MIDAZOLAM HCL 5 MG/5ML IJ SOLN
INTRAMUSCULAR | Status: DC | PRN
  Administered 2015-03-10: 2 mg via INTRAVENOUS

## 2015-03-10 MED ORDER — PROPOFOL 10 MG/ML IV BOLUS
INTRAVENOUS | Status: DC | PRN
  Administered 2015-03-10: 160 mg via INTRAVENOUS

## 2015-03-10 MED ORDER — HYDROMORPHONE HCL 1 MG/ML IJ SOLN
INTRAMUSCULAR | Status: AC
  Filled 2015-03-10: qty 1

## 2015-03-10 MED ORDER — LEVOTHYROXINE SODIUM 125 MCG PO TABS
125.0000 ug | ORAL_TABLET | Freq: Every day | ORAL | Status: DC
  Administered 2015-03-10 – 2015-03-11 (×2): 125 ug via ORAL
  Filled 2015-03-10 (×2): qty 1

## 2015-03-10 MED ORDER — LACTATED RINGERS IV SOLN
INTRAVENOUS | Status: DC | PRN
  Administered 2015-03-10 (×3): via INTRAVENOUS

## 2015-03-10 MED ORDER — INSULIN ASPART 100 UNIT/ML ~~LOC~~ SOLN
0.0000 [IU] | SUBCUTANEOUS | Status: DC
  Administered 2015-03-10 (×2): 5 [IU] via SUBCUTANEOUS
  Administered 2015-03-11: 2 [IU] via SUBCUTANEOUS

## 2015-03-10 MED ORDER — IRBESARTAN 300 MG PO TABS
300.0000 mg | ORAL_TABLET | Freq: Every day | ORAL | Status: DC
  Administered 2015-03-10 – 2015-03-11 (×2): 300 mg via ORAL
  Filled 2015-03-10 (×2): qty 1

## 2015-03-10 MED ORDER — SODIUM CHLORIDE 0.9 % IR SOLN
Status: DC | PRN
Start: 2015-03-10 — End: 2015-03-10
  Administered 2015-03-10: 1000 mL via INTRAVESICAL

## 2015-03-10 MED ORDER — HYDROMORPHONE HCL 1 MG/ML IJ SOLN
INTRAMUSCULAR | Status: DC | PRN
  Administered 2015-03-10 (×2): 0.5 mg via INTRAVENOUS
  Administered 2015-03-10: 1 mg via INTRAVENOUS

## 2015-03-10 MED ORDER — ARTIFICIAL TEARS OP OINT
TOPICAL_OINTMENT | OPHTHALMIC | Status: DC | PRN
  Administered 2015-03-10: 1 via OPHTHALMIC

## 2015-03-10 MED ORDER — ROCURONIUM BROMIDE 100 MG/10ML IV SOLN
INTRAVENOUS | Status: AC
  Filled 2015-03-10: qty 1

## 2015-03-10 MED ORDER — LIDOCAINE HCL (CARDIAC) 20 MG/ML IV SOLN
INTRAVENOUS | Status: AC
  Filled 2015-03-10: qty 5

## 2015-03-10 MED ORDER — SUGAMMADEX SODIUM 200 MG/2ML IV SOLN
INTRAVENOUS | Status: AC
  Filled 2015-03-10: qty 2

## 2015-03-10 MED ORDER — FENTANYL CITRATE (PF) 250 MCG/5ML IJ SOLN
INTRAMUSCULAR | Status: AC
  Filled 2015-03-10: qty 5

## 2015-03-10 MED ORDER — ONDANSETRON HCL 4 MG/2ML IJ SOLN
INTRAMUSCULAR | Status: DC | PRN
  Administered 2015-03-10: 4 mg via INTRAVENOUS

## 2015-03-10 MED ORDER — PROMETHAZINE HCL 25 MG/ML IJ SOLN
INTRAMUSCULAR | Status: AC
  Filled 2015-03-10: qty 1

## 2015-03-10 MED ORDER — PHENYLEPHRINE HCL 10 MG/ML IJ SOLN
10.0000 mg | INTRAVENOUS | Status: DC | PRN
  Administered 2015-03-10: 50 ug/min via INTRAVENOUS

## 2015-03-10 MED ORDER — ACETAMINOPHEN 325 MG PO TABS: 650.0000 mg | ORAL_TABLET | ORAL | Status: DC | PRN

## 2015-03-10 MED ORDER — OXYCODONE HCL 5 MG/5ML PO SOLN
5.0000 mg | Freq: Once | ORAL | Status: DC | PRN
  Filled 2015-03-10: qty 5

## 2015-03-10 MED ORDER — OXYCODONE HCL 5 MG PO TABS: 5.0000 mg | ORAL_TABLET | Freq: Once | ORAL | Status: DC | PRN

## 2015-03-10 MED ORDER — LIDOCAINE HCL (CARDIAC) 20 MG/ML IV SOLN
INTRAVENOUS | Status: DC | PRN
  Administered 2015-03-10: 100 mg via INTRAVENOUS

## 2015-03-10 MED ORDER — AMLODIPINE BESYLATE-VALSARTAN 10-320 MG PO TABS: 1.0000 | ORAL_TABLET | Freq: Every day | ORAL | Status: DC

## 2015-03-10 MED ORDER — PROPOFOL 10 MG/ML IV BOLUS
INTRAVENOUS | Status: AC
  Filled 2015-03-10: qty 20

## 2015-03-10 MED ORDER — AMLODIPINE BESYLATE 10 MG PO TABS
10.0000 mg | ORAL_TABLET | Freq: Every day | ORAL | Status: DC
  Administered 2015-03-11: 10 mg via ORAL
  Filled 2015-03-10 (×2): qty 1

## 2015-03-10 MED ORDER — CEFAZOLIN SODIUM 1-5 GM-% IV SOLN
1.0000 g | Freq: Three times a day (TID) | INTRAVENOUS | Status: AC
  Administered 2015-03-10 (×2): 1 g via INTRAVENOUS
  Filled 2015-03-10 (×2): qty 50

## 2015-03-10 MED ORDER — HEPARIN SODIUM (PORCINE) 1000 UNIT/ML IJ SOLN
INTRAMUSCULAR | Status: AC
  Filled 2015-03-10: qty 1

## 2015-03-10 MED ORDER — METOCLOPRAMIDE HCL 5 MG/ML IJ SOLN
INTRAMUSCULAR | Status: DC | PRN
  Administered 2015-03-10: 10 mg via INTRAVENOUS

## 2015-03-10 MED ORDER — PHENYLEPHRINE HCL 10 MG/ML IJ SOLN: INTRAMUSCULAR | Status: DC | PRN

## 2015-03-10 MED ORDER — ATROPINE SULFATE 0.4 MG/ML IJ SOLN
INTRAMUSCULAR | Status: AC
  Filled 2015-03-10: qty 1

## 2015-03-10 MED ORDER — SUGAMMADEX SODIUM 200 MG/2ML IV SOLN
INTRAVENOUS | Status: DC | PRN
  Administered 2015-03-10: 200 mg via INTRAVENOUS

## 2015-03-10 MED ORDER — DIPHENHYDRAMINE HCL 12.5 MG/5ML PO ELIX: 12.5000 mg | ORAL_SOLUTION | Freq: Four times a day (QID) | ORAL | Status: DC | PRN

## 2015-03-10 MED ORDER — METOCLOPRAMIDE HCL 5 MG/ML IJ SOLN
INTRAMUSCULAR | Status: AC
  Filled 2015-03-10: qty 2

## 2015-03-10 MED ORDER — SULFAMETHOXAZOLE-TRIMETHOPRIM 800-160 MG PO TABS: 1.0000 | ORAL_TABLET | Freq: Two times a day (BID) | ORAL | Status: DC

## 2015-03-10 MED ORDER — DOCUSATE SODIUM 100 MG PO CAPS
100.0000 mg | ORAL_CAPSULE | Freq: Two times a day (BID) | ORAL | Status: DC
  Administered 2015-03-10 – 2015-03-11 (×3): 100 mg via ORAL
  Filled 2015-03-10 (×3): qty 1

## 2015-03-10 MED ORDER — EPHEDRINE SULFATE 50 MG/ML IJ SOLN
INTRAMUSCULAR | Status: AC
  Filled 2015-03-10: qty 1

## 2015-03-10 MED ORDER — LACTATED RINGERS IV SOLN
INTRAVENOUS | Status: DC | PRN
  Administered 2015-03-10: 08:00:00

## 2015-03-10 MED ORDER — KETOROLAC TROMETHAMINE 15 MG/ML IJ SOLN
15.0000 mg | Freq: Four times a day (QID) | INTRAMUSCULAR | Status: DC
  Administered 2015-03-10 – 2015-03-11 (×5): 15 mg via INTRAVENOUS
  Filled 2015-03-10 (×6): qty 1

## 2015-03-10 MED ORDER — STERILE WATER FOR IRRIGATION IR SOLN
Status: DC | PRN
  Administered 2015-03-10: 1000 mL

## 2015-03-10 MED ORDER — CEFAZOLIN SODIUM-DEXTROSE 2-3 GM-% IV SOLR
2.0000 g | INTRAVENOUS | Status: AC
  Administered 2015-03-10: 2 g via INTRAVENOUS

## 2015-03-10 MED ORDER — ONDANSETRON HCL 4 MG/2ML IJ SOLN
INTRAMUSCULAR | Status: AC
  Filled 2015-03-10: qty 2

## 2015-03-10 SURGICAL SUPPLY — 52 items
APPLICATOR COTTON TIP 6IN STRL (MISCELLANEOUS) ×3 IMPLANT
CATH FOLEY 2WAY SLVR 18FR 30CC (CATHETERS) ×3 IMPLANT
CATH ROBINSON RED A/P 16FR (CATHETERS) ×3 IMPLANT
CATH ROBINSON RED A/P 8FR (CATHETERS) ×3 IMPLANT
CATH TIEMANN FOLEY 18FR 5CC (CATHETERS) ×3 IMPLANT
CHLORAPREP W/TINT 26ML (MISCELLANEOUS) ×3 IMPLANT
CLIP LIGATING HEM O LOK PURPLE (MISCELLANEOUS) ×6 IMPLANT
CLOTH BEACON ORANGE TIMEOUT ST (SAFETY) ×3 IMPLANT
COVER SURGICAL LIGHT HANDLE (MISCELLANEOUS) ×3 IMPLANT
COVER TIP SHEARS 8 DVNC (MISCELLANEOUS) ×2 IMPLANT
COVER TIP SHEARS 8MM DA VINCI (MISCELLANEOUS) ×1
CUTTER ECHEON FLEX ENDO 45 340 (ENDOMECHANICALS) ×3 IMPLANT
DECANTER SPIKE VIAL GLASS SM (MISCELLANEOUS) IMPLANT
DRAPE ARM DVNC X/XI (DISPOSABLE) ×8 IMPLANT
DRAPE COLUMN DVNC XI (DISPOSABLE) ×2 IMPLANT
DRAPE DA VINCI XI ARM (DISPOSABLE) ×4
DRAPE DA VINCI XI COLUMN (DISPOSABLE) ×1
DRAPE SURG IRRIG POUCH 19X23 (DRAPES) ×3 IMPLANT
DRSG TEGADERM 4X4.75 (GAUZE/BANDAGES/DRESSINGS) ×3 IMPLANT
ELECT REM PT RETURN 9FT ADLT (ELECTROSURGICAL) ×3
ELECTRODE REM PT RTRN 9FT ADLT (ELECTROSURGICAL) ×2 IMPLANT
GLOVE BIO SURGEON STRL SZ 6.5 (GLOVE) ×3 IMPLANT
GLOVE BIOGEL M STRL SZ7.5 (GLOVE) ×6 IMPLANT
GOWN STRL REUS W/TWL LRG LVL3 (GOWN DISPOSABLE) ×9 IMPLANT
HOLDER FOLEY CATH W/STRAP (MISCELLANEOUS) ×3 IMPLANT
IV LACTATED RINGERS 1000ML (IV SOLUTION) IMPLANT
LIQUID BAND (GAUZE/BANDAGES/DRESSINGS) IMPLANT
MANIFOLD NEPTUNE II (INSTRUMENTS) ×3 IMPLANT
NDL SAFETY ECLIPSE 18X1.5 (NEEDLE) ×2 IMPLANT
NEEDLE HYPO 18GX1.5 SHARP (NEEDLE) ×1
PACK ROBOT UROLOGY CUSTOM (CUSTOM PROCEDURE TRAY) ×3 IMPLANT
RELOAD GREEN ECHELON 45 (STAPLE) ×3 IMPLANT
SEAL CANN UNIV 5-8 DVNC XI (MISCELLANEOUS) ×8 IMPLANT
SEAL XI 5MM-8MM UNIVERSAL (MISCELLANEOUS) ×4
SET TUBE IRRIG SUCTION NO TIP (IRRIGATION / IRRIGATOR) ×3 IMPLANT
SOLUTION ELECTROLUBE (MISCELLANEOUS) ×3 IMPLANT
SUT ETHILON 3 0 PS 1 (SUTURE) ×3 IMPLANT
SUT MNCRL 3 0 RB1 (SUTURE) ×2 IMPLANT
SUT MNCRL 3 0 VIOLET RB1 (SUTURE) ×2 IMPLANT
SUT MNCRL AB 4-0 PS2 18 (SUTURE) ×6 IMPLANT
SUT MONOCRYL 3 0 RB1 (SUTURE) ×2
SUT VIC AB 0 CT1 27 (SUTURE) ×1
SUT VIC AB 0 CT1 27XBRD ANTBC (SUTURE) ×2 IMPLANT
SUT VIC AB 0 UR5 27 (SUTURE) ×3 IMPLANT
SUT VIC AB 2-0 SH 27 (SUTURE) ×1
SUT VIC AB 2-0 SH 27X BRD (SUTURE) ×2 IMPLANT
SUT VICRYL 0 UR6 27IN ABS (SUTURE) ×6 IMPLANT
SYR 27GX1/2 1ML LL SAFETY (SYRINGE) ×3 IMPLANT
TOWEL OR 17X26 10 PK STRL BLUE (TOWEL DISPOSABLE) ×3 IMPLANT
TOWEL OR NON WOVEN STRL DISP B (DISPOSABLE) ×3 IMPLANT
TUBING INSUFFLATION 10FT LAP (TUBING) IMPLANT
WATER STERILE IRR 1500ML POUR (IV SOLUTION) IMPLANT

## 2015-03-10 NOTE — Anesthesia Preprocedure Evaluation (Addendum)
Anesthesia Evaluation  Patient identified by MRN, date of birth, ID band Patient awake    Reviewed: Allergy & Precautions, H&P , NPO status , Patient's Chart, lab work & pertinent test results  History of Anesthesia Complications Negative for: history of anesthetic complications  Airway Mallampati: II  TM Distance: >3 FB Neck ROM: full    Dental no notable dental hx.    Pulmonary neg pulmonary ROS,    Pulmonary exam normal breath sounds clear to auscultation       Cardiovascular hypertension, Pt. on medications Normal cardiovascular exam Rhythm:regular Rate:Normal     Neuro/Psych negative neurological ROS     GI/Hepatic Neg liver ROS, GERD  ,  Endo/Other  diabetes, Type 2, Oral Hypoglycemic AgentsHypothyroidism   Renal/GU negative Renal ROS     Musculoskeletal   Abdominal   Peds  Hematology negative hematology ROS (+)   Anesthesia Other Findings   Reproductive/Obstetrics negative OB ROS                            Anesthesia Physical Anesthesia Plan  ASA: III  Anesthesia Plan: General   Post-op Pain Management:    Induction: Intravenous  Airway Management Planned: Oral ETT  Additional Equipment:   Intra-op Plan:   Post-operative Plan: Extubation in OR  Informed Consent: I have reviewed the patients History and Physical, chart, labs and discussed the procedure including the risks, benefits and alternatives for the proposed anesthesia with the patient or authorized representative who has indicated his/her understanding and acceptance.   Dental Advisory Given  Plan Discussed with: Anesthesiologist, CRNA and Surgeon  Anesthesia Plan Comments:         Anesthesia Quick Evaluation

## 2015-03-10 NOTE — Transfer of Care (Signed)
Immediate Anesthesia Transfer of Care Note  Patient: Johnny Smith  Procedure(s) Performed: Procedure(s): ROBOTIC ASSISTED LAPAROSCOPIC RADICAL PROSTATECTOMY LEVEL 2 (N/A) LYMPHADENECTOMY (Bilateral)  Patient Location: PACU  Anesthesia Type:General  Level of Consciousness:  sedated, patient cooperative and responds to stimulation  Airway & Oxygen Therapy:Patient Spontanous Breathing and Patient connected to face mask oxgen  Post-op Assessment:  Report given to PACU RN and Post -op Vital signs reviewed and stable  Post vital signs:  Reviewed and stable  Last Vitals:  Filed Vitals:   03/10/15 0524  BP: 124/87  Pulse: 78  Temp: 36.5 C  Resp: 18    Complications: No apparent anesthesia complications

## 2015-03-10 NOTE — Op Note (Signed)

## 2015-03-10 NOTE — Anesthesia Procedure Notes (Signed)
Procedure Name: Intubation Date/Time: 03/10/2015 7:25 AM Performed by: Montel Clock Pre-anesthesia Checklist: Patient identified, Emergency Drugs available, Suction available, Patient being monitored and Timeout performed Patient Re-evaluated:Patient Re-evaluated prior to inductionOxygen Delivery Method: Circle system utilized Preoxygenation: Pre-oxygenation with 100% oxygen Intubation Type: IV induction Ventilation: Mask ventilation without difficulty and Oral airway inserted - appropriate to patient size Laryngoscope Size: Mac and 3 Grade View: Grade I Tube type: Oral Tube size: 7.5 mm Number of attempts: 1 Airway Equipment and Method: Stylet Placement Confirmation: ETT inserted through vocal cords under direct vision,  positive ETCO2 and breath sounds checked- equal and bilateral Secured at: 23 cm Tube secured with: Tape Dental Injury: Teeth and Oropharynx as per pre-operative assessment

## 2015-03-10 NOTE — Discharge Instructions (Signed)

## 2015-03-10 NOTE — Progress Notes (Signed)
Day of Surgery Subjective: The patient is doing well.  No nausea or vomiting. Pain is adequately controlled.  Objective: Vital signs in last 24 hours: Temp:  [97.7 F (36.5 C)-98.2 F (36.8 C)] 97.8 F (36.6 C) (03/09 1251) Pulse Rate:  [78-93] 89 (03/09 1251) Resp:  [12-25] 20 (03/09 1251) BP: (118-150)/(76-100) 120/76 mmHg (03/09 1251) SpO2:  [94 %-100 %] 97 % (03/09 1251) Weight:  [79.379 kg (175 lb)] 79.379 kg (175 lb) (03/09 0524)  Intake/Output from previous day:   Intake/Output this shift: Total I/O In: 3900 [I.V.:2900; IV Piggyback:1000] Out: 1460 [Urine:1225; Drains:85; Blood:150]  Physical Exam:  General: Alert and oriented. CV: RRR Lungs: Clear bilaterally. GI: Soft, Nondistended. Incisions: Clean, dry, and intact Urine: Clear Extremities: Nontender, no erythema, no edema.  Lab Results:  Recent Labs  03/10/15 1115  HGB 12.4*  HCT 37.3*      Assessment/Plan: POD# 0 s/p robotic prostatectomy. Doing well. Pathway    LOS: 0 days   Christell Faith 03/10/2015, 3:20 PM

## 2015-03-11 LAB — GLUCOSE, CAPILLARY
GLUCOSE-CAPILLARY: 103 mg/dL — AB (ref 65–99)
GLUCOSE-CAPILLARY: 138 mg/dL — AB (ref 65–99)
Glucose-Capillary: 120 mg/dL — ABNORMAL HIGH (ref 65–99)
Glucose-Capillary: 120 mg/dL — ABNORMAL HIGH (ref 65–99)

## 2015-03-11 LAB — HEMOGLOBIN AND HEMATOCRIT, BLOOD
HCT: 31.6 % — ABNORMAL LOW (ref 39.0–52.0)
HEMATOCRIT: 30.1 % — AB (ref 39.0–52.0)
HEMOGLOBIN: 10.6 g/dL — AB (ref 13.0–17.0)
Hemoglobin: 10.1 g/dL — ABNORMAL LOW (ref 13.0–17.0)

## 2015-03-11 MED ORDER — HYDROCODONE-ACETAMINOPHEN 5-325 MG PO TABS
1.0000 | ORAL_TABLET | Freq: Four times a day (QID) | ORAL | Status: DC | PRN
  Administered 2015-03-11: 1 via ORAL
  Filled 2015-03-11: qty 1

## 2015-03-11 MED ORDER — BISACODYL 10 MG RE SUPP
10.0000 mg | Freq: Once | RECTAL | Status: AC
  Administered 2015-03-11: 10 mg via RECTAL
  Filled 2015-03-11: qty 1

## 2015-03-11 NOTE — Progress Notes (Signed)
Patient ID: Johnny Smith, male   DOB: 1950-01-09, 65 y.o.   MRN: [id number]  1 Day Post-Op Subjective: The patient is doing well.  No nausea or vomiting. Pain is adequately controlled.  Objective: Vital signs in last 24 hours: Temp:  [97.6 F (36.4 C)-98.4 F (36.9 C)] 97.6 F (36.4 C) (03/10 0509) Pulse Rate:  [66-93] 72 (03/10 0509) Resp:  [12-25] 18 (03/10 0509) BP: (97-150)/(70-100) 111/78 mmHg (03/10 0509) SpO2:  [94 %-100 %] 100 % (03/10 0509)  Intake/Output from previous day: 03/09 0701 - 03/10 0700 In: 7062.5 [P.O.:720; I.V.:5292.5; IV Piggyback:1050] Out: [id number] [Urine:3075; Drains:225; Blood:150] Intake/Output this shift:    Physical Exam:  General: Alert and oriented. CV: RRR Lungs: Clear bilaterally. GI: Soft, Nondistended. Incisions: Clean, dry, and intact Urine: Clear Extremities: Nontender, no erythema, no edema.  Lab Results:  Recent Labs  03/10/15 1115 03/11/15 0441  HGB 12.4* 10.1*  HCT 37.3* 30.1*      Assessment/Plan: POD# 1 s/p robotic prostatectomy.  1) SL IVF 2) Ambulate, Incentive spirometry 3) Transition to oral pain medication 4) Dulcolax suppository 5) D/C pelvic drain 6) Recheck Hgb 7) Plan for likely discharge later today   Pryor Curia. MD   LOS: 1 day   Javonna Balli,LES 03/11/2015, 7:10 AM

## 2015-03-11 NOTE — Care Management Note (Signed)
Case Management Note  Patient Details  Name: Johnny Smith MRN: [id number] Date of Birth: 07/17/1950  Subjective/Objective:  65 y/o m admitted w/Prostate Ca. S/p sx. From home.                  Action/Plan:d/c home no needs or orders.   Expected Discharge Date:                  Expected Discharge Plan:  Home/Self Care  In-House Referral:     Discharge planning Services  CM Consult  Post Acute Care Choice:    Choice offered to:     DME Arranged:    DME Agency:     HH Arranged:    [place] Agency:     Status of Service:  Completed, signed off  Medicare Important Message Given:    Date Medicare IM Given:    Medicare IM give by:    Date Additional Medicare IM Given:    Additional Medicare Important Message give by:     If discussed at [place] of Stay Meetings, dates discussed:    Additional Comments:  Dessa Phi, RN 03/11/2015, 12:45 PM

## 2015-03-11 NOTE — Discharge Summary (Signed)
Date of admission: 03/10/2015  Date of discharge: 03/11/2015  Admission diagnosis: Prostate Cancer  Discharge diagnosis: Prostate Cancer  History and Physical: For full details, please see admission history and physical. Briefly, Johnny Smith is a 65 y.o. gentleman with localized prostate cancer.  After discussing management/treatment options, he elected to proceed with surgical treatment.  Hospital Course: Johnny Smith was taken to the operating room on 03/10/2015 and underwent a robotic assisted laparoscopic radical prostatectomy. He tolerated this procedure well and without complications. Postoperatively, he was able to be transferred to a regular hospital room following recovery from anesthesia.  He was able to begin ambulating the night of surgery. He remained hemodynamically stable overnight.  He had excellent urine output with appropriately minimal output from his pelvic drain and his pelvic drain was removed on POD #1. A repeat CBC on POD#1 showed stable hgb.  He was transitioned to oral pain medication, tolerated a clear liquid diet, and had met all discharge criteria and was able to be discharged home later on POD#1.  Laboratory values:   Recent Labs  03/10/15 1115 03/11/15 0441 03/11/15 1119  HGB 12.4* 10.1* 10.6*  HCT 37.3* 30.1* 31.6*    Disposition: Home  Discharge instruction: He was instructed to be ambulatory but to refrain from heavy lifting, strenuous activity, or driving. He was instructed on urethral catheter care.  Discharge medications:     Medication List    STOP taking these medications        CINNAMON PO     nystatin cream  Commonly known as:  MYCOSTATIN     rosuvastatin 10 MG tablet  Commonly known as:  CRESTOR     rosuvastatin 20 MG tablet  Commonly known as:  CRESTOR      TAKE these medications        amLODipine-valsartan 10-320 MG tablet  Commonly known as:  EXFORGE  Take 1 tablet by mouth daily.     esomeprazole 40 MG capsule  Commonly  known as:  NEXIUM  Take 1 capsule (40 mg total) by mouth daily before breakfast.     fenofibrate 145 MG tablet  Commonly known as:  TRICOR  Take 1 tablet (145 mg total) by mouth daily.     ferrous sulfate 325 (65 FE) MG EC tablet  Take 325 mg by mouth daily.     glipiZIDE 10 MG tablet  Commonly known as:  GLUCOTROL  Take 10 mg by mouth daily before breakfast.     HYDROcodone-acetaminophen 5-325 MG tablet  Commonly known as:  NORCO  Take 1-2 tablets by mouth every 6 (six) hours as needed.     levothyroxine 125 MCG tablet  Commonly known as:  SYNTHROID, LEVOTHROID  Take 125 mcg by mouth daily before breakfast.     metFORMIN 1000 MG tablet  Commonly known as:  GLUCOPHAGE  Take 1,000 mg by mouth 2 (two) times daily with a meal.     metoCLOPramide 10 MG tablet  Commonly known as:  REGLAN  Take 10 mg by mouth every 6 (six) hours as needed for nausea.     metoprolol succinate 100 MG 24 hr tablet  Commonly known as:  TOPROL-XL  Take 1 tablet (100 mg total) by mouth daily. Take with or immediately following a meal.     omeprazole 40 MG capsule  Commonly known as:  PRILOSEC  Take 40 mg by mouth daily.     potassium chloride SA 20 MEQ tablet  Commonly known as:  K-DUR,KLOR-CON  Take  1 tablet (20 mEq total) by mouth daily.     simvastatin 20 MG tablet  Commonly known as:  ZOCOR  Take 20 mg by mouth daily.     sitaGLIPtin 100 MG tablet  Commonly known as:  JANUVIA  Take 1 tablet (100 mg total) by mouth daily.     sulfamethoxazole-trimethoprim 800-160 MG tablet  Commonly known as:  BACTRIM DS,SEPTRA DS  Take 1 tablet by mouth 2 (two) times daily. Start the day prior to foley removal appointment        Followup: He will followup in 1 week for catheter removal and to discuss his surgical pathology results.

## 2015-03-11 NOTE — Progress Notes (Signed)
Pt's discharge home with leg. Discharge instructions including prescription, foley and incision care were reviewed with pt. Pt verbalized understanding. New catheter given to pt.

## 2015-03-14 NOTE — Anesthesia Postprocedure Evaluation (Signed)
Anesthesia Post Note  Patient: [name]  Procedure(s) Performed: Procedure(s) (LRB): ROBOTIC ASSISTED LAPAROSCOPIC RADICAL PROSTATECTOMY LEVEL 2 (N/A) LYMPHADENECTOMY (Bilateral)  Patient location during evaluation: PACU Anesthesia Type: General Level of consciousness: awake and alert Pain management: pain level controlled Vital Signs Assessment: post-procedure vital signs reviewed and stable Respiratory status: spontaneous breathing, nonlabored ventilation, respiratory function stable and patient connected to nasal cannula oxygen Cardiovascular status: blood pressure returned to baseline and stable Postop Assessment: no signs of nausea or vomiting Anesthetic complications: no                   Johnny Smith

## 2015-07-29 ENCOUNTER — Other Ambulatory Visit: Payer: Self-pay | Admitting: Gastroenterology

## 2016-01-12 ENCOUNTER — Ambulatory Visit: Payer: BLUE CROSS/BLUE SHIELD | Attending: Family Medicine | Admitting: Family Medicine

## 2016-01-12 ENCOUNTER — Encounter: Payer: Self-pay | Admitting: Family Medicine

## 2016-01-12 VITALS — BP 132/84 | HR 82 | Temp 98.1°F | Resp 18 | Ht 70.0 in | Wt 172.4 lb

## 2016-01-12 DIAGNOSIS — E119 Type 2 diabetes mellitus without complications: Secondary | ICD-10-CM | POA: Diagnosis not present

## 2016-01-12 DIAGNOSIS — Z0000 Encounter for general adult medical examination without abnormal findings: Secondary | ICD-10-CM

## 2016-01-12 DIAGNOSIS — Z8546 Personal history of malignant neoplasm of prostate: Secondary | ICD-10-CM | POA: Diagnosis not present

## 2016-01-12 DIAGNOSIS — M79604 Pain in right leg: Secondary | ICD-10-CM

## 2016-01-12 DIAGNOSIS — Z7984 Long term (current) use of oral hypoglycemic drugs: Secondary | ICD-10-CM | POA: Diagnosis not present

## 2016-01-12 DIAGNOSIS — M79651 Pain in right thigh: Secondary | ICD-10-CM | POA: Insufficient documentation

## 2016-01-12 DIAGNOSIS — M545 Low back pain: Secondary | ICD-10-CM | POA: Insufficient documentation

## 2016-01-12 DIAGNOSIS — R200 Anesthesia of skin: Secondary | ICD-10-CM | POA: Diagnosis not present

## 2016-01-12 DIAGNOSIS — Z9079 Acquired absence of other genital organ(s): Secondary | ICD-10-CM | POA: Diagnosis not present

## 2016-01-12 DIAGNOSIS — G8929 Other chronic pain: Secondary | ICD-10-CM | POA: Insufficient documentation

## 2016-01-12 DIAGNOSIS — K219 Gastro-esophageal reflux disease without esophagitis: Secondary | ICD-10-CM | POA: Diagnosis not present

## 2016-01-12 DIAGNOSIS — I10 Essential (primary) hypertension: Secondary | ICD-10-CM | POA: Diagnosis not present

## 2016-01-12 DIAGNOSIS — Z79899 Other long term (current) drug therapy: Secondary | ICD-10-CM | POA: Diagnosis not present

## 2016-01-12 LAB — CBC WITH DIFFERENTIAL/PLATELET
BASOS ABS: 0 {cells}/uL (ref 0–200)
Basophils Relative: 0 %
EOS ABS: 118 {cells}/uL (ref 15–500)
Eosinophils Relative: 2 %
HEMATOCRIT: 41.7 % (ref 38.5–50.0)
HEMOGLOBIN: 13.9 g/dL (ref 13.2–17.1)
LYMPHS ABS: 1947 {cells}/uL (ref 850–3900)
LYMPHS PCT: 33 %
MCH: 30.5 pg (ref 27.0–33.0)
MCHC: 33.3 g/dL (ref 32.0–36.0)
MCV: 91.4 fL (ref 80.0–100.0)
MONO ABS: 590 {cells}/uL (ref 200–950)
MPV: 10 fL (ref 7.5–12.5)
Monocytes Relative: 10 %
NEUTROS PCT: 55 %
Neutro Abs: 3245 cells/uL (ref 1500–7800)
Platelets: 307 10*3/uL (ref 140–400)
RBC: 4.56 MIL/uL (ref 4.20–5.80)
RDW: 15 % (ref 11.0–15.0)
WBC: 5.9 10*3/uL (ref 3.8–10.8)

## 2016-01-12 LAB — TSH: TSH: 0.49 m[IU]/L (ref 0.40–4.50)

## 2016-01-12 LAB — GLUCOSE, POCT (MANUAL RESULT ENTRY): POC Glucose: 147 mg/dl — AB (ref 70–99)

## 2016-01-12 LAB — POCT GLYCOSYLATED HEMOGLOBIN (HGB A1C): Hemoglobin A1C: 8

## 2016-01-12 MED ORDER — METOPROLOL SUCCINATE ER 100 MG PO TB24: 100.0000 mg | ORAL_TABLET | Freq: Every day | ORAL | 3 refills | Status: DC

## 2016-01-12 MED ORDER — GLIPIZIDE 10 MG PO TABS: 10.0000 mg | ORAL_TABLET | Freq: Every day | ORAL | 2 refills | Status: DC

## 2016-01-12 MED ORDER — METFORMIN HCL 1000 MG PO TABS
1000.0000 mg | ORAL_TABLET | Freq: Two times a day (BID) | ORAL | 2 refills | Status: DC
Start: 2016-01-12 — End: 2016-02-21

## 2016-01-12 MED ORDER — SITAGLIPTIN PHOSPHATE 100 MG PO TABS: 100.0000 mg | ORAL_TABLET | Freq: Every day | ORAL | 2 refills | Status: DC

## 2016-01-12 MED ORDER — OMEPRAZOLE 40 MG PO CPDR: 40.0000 mg | DELAYED_RELEASE_CAPSULE | Freq: Every day | ORAL | 0 refills | Status: DC

## 2016-01-12 NOTE — Patient Instructions (Signed)

## 2016-01-12 NOTE — Progress Notes (Signed)
Subjective:  Patient ID: Johnny Smith, male    DOB: May 31, 1950  Age: 66 y.o. MRN: [id number]  CC: Diabetes   HPI [place] Dohrn presents for annal physical examination. He reports history of a prosectomy in March 2017 for prostate cancer. He reports having a colonoscopy performed in July 2017 He denies any bladder or bowel changes. He c/o chronic right sided lower back, right thigh pain and numbness due to a history of arthritis. He rates the pain 8/10 at its worst. Reports seeing provider in the past for pain who recommended x-ray for referral to pain clinic. Denies symptoms in all other pertinent systems.   Outpatient Medications Prior to Visit  Medication Sig Dispense Refill  . amLODipine-valsartan (EXFORGE) 10-320 MG per tablet Take 1 tablet by mouth daily. 30 tablet 3  . ferrous sulfate 325 (65 FE) MG EC tablet Take 325 mg by mouth daily.    Marland Kitchen glipiZIDE (GLUCOTROL) 10 MG tablet Take 10 mg by mouth daily before breakfast.    . HYDROcodone-acetaminophen (NORCO) 5-325 MG tablet Take 1-2 tablets by mouth every 6 (six) hours as needed. 30 tablet 0  . levothyroxine (SYNTHROID, LEVOTHROID) 125 MCG tablet Take 125 mcg by mouth daily before breakfast.    . metFORMIN (GLUCOPHAGE) 1000 MG tablet Take 1,000 mg by mouth 2 (two) times daily with a meal.    . metoCLOPramide (REGLAN) 10 MG tablet Take 10 mg by mouth every 6 (six) hours as needed for nausea.    . metoprolol succinate (TOPROL-XL) 100 MG 24 hr tablet Take 1 tablet (100 mg total) by mouth daily. Take with or immediately following a meal. 30 tablet 3  . omeprazole (PRILOSEC) 40 MG capsule Take 40 mg by mouth daily.    . potassium chloride SA (K-DUR,KLOR-CON) 20 MEQ tablet Take 1 tablet (20 mEq total) by mouth daily. 30 tablet 3  . simvastatin (ZOCOR) 20 MG tablet Take 20 mg by mouth daily.    . sitaGLIPtin (JANUVIA) 100 MG tablet Take 1 tablet (100 mg total) by mouth daily. 30 tablet 3  . sulfamethoxazole-trimethoprim (BACTRIM DS,SEPTRA DS)  800-160 MG tablet Take 1 tablet by mouth 2 (two) times daily. Start the day prior to foley removal appointment 6 tablet 0  . esomeprazole (NEXIUM) 40 MG capsule Take 1 capsule (40 mg total) by mouth daily before breakfast. (Patient not taking: Reported on 01/12/2016) 30 capsule 3  . fenofibrate (TRICOR) 145 MG tablet Take 1 tablet (145 mg total) by mouth daily. (Patient not taking: Reported on 01/12/2016) 30 tablet 3   No facility-administered medications prior to visit.     ROS Review of Systems  Constitutional: Negative.   HENT: Negative.   Eyes: Negative.   Respiratory: Negative.   Cardiovascular: Negative.   Gastrointestinal: Negative.   Genitourinary: Negative.   Musculoskeletal: Positive for arthralgias and back pain.  Skin: Negative.   Neurological: Positive for numbness.  Psychiatric/Behavioral: Negative.     Objective:  BP 132/84 (BP Location: Right Arm, Patient Position: Sitting, Cuff Size: Normal)   Pulse 82   Temp 98.1 F (36.7 C) (Oral)   Resp 18   Ht 5\' 10"  (1.778 m)   Wt 172 lb 6.4 oz (78.2 kg)   SpO2 97%   BMI 24.74 kg/m   BP/Weight 01/12/2016 [id number] [id number]  Systolic BP [id number] -  Diastolic BP 84 69 -  Wt. (Lbs) 172.4 - 175  BMI 24.74 - 24.42   Physical Exam  Constitutional: He is oriented to person, place,  and time. He appears well-developed and well-nourished.  HENT:  Right Ear: External ear normal.  Left Ear: External ear normal.  Nose: Nose normal.  Mouth/Throat: Oropharynx is clear and moist.  Eyes: Conjunctivae and EOM are normal. Pupils are equal, round, and reactive to light.  Wears corrective lenses.  Neck: Normal range of motion. Neck supple. No JVD present.  Cardiovascular: Normal rate, regular rhythm, normal heart sounds and intact distal pulses.   Pulmonary/Chest: Effort normal and breath sounds normal.  Abdominal: Soft. Bowel sounds are normal.  Musculoskeletal: Normal range of motion.       Lumbar back: He exhibits pain.  Right  sided lower back pain that extends to right upper leg associated with numbness. No swelling present.   Lymphadenopathy:    He has no cervical adenopathy.  Neurological: He is alert and oriented to person, place, and time.  Skin: Skin is warm and dry.  Psychiatric: He has a normal mood and affect. His behavior is normal. Thought content normal.   Assessment & Plan:   Problem List Items Addressed This Visit      Cardiovascular and Mediastinum   HTN (hypertension)   Relevant Medications   metoprolol succinate (TOPROL-XL) 100 MG 24 hr tablet     Digestive   GERD (gastroesophageal reflux disease)   Relevant Medications   omeprazole (PRILOSEC) 40 MG capsule     Endocrine   Type 2 diabetes mellitus (HCC) - Primary   Relevant Medications   sitaGLIPtin (JANUVIA) 100 MG tablet   metFORMIN (GLUCOPHAGE) 1000 MG tablet   glipiZIDE (GLUCOTROL) 10 MG tablet   Other Relevant Orders   Glucose (CBG) (Completed)   HgB A1c (Completed)    Other Visit Diagnoses    Annual physical exam       Relevant Orders   Basic Metabolic Panel (Completed)   CBC with Differential (Completed)   PSA (Completed)   TSH (Completed)   Vitamin D, 25-hydroxy (Completed)   Lipid panel (Completed)   Numbness in right leg       Relevant Orders   DG Lumbar Spine Complete   Chronic bilateral low back pain, with sciatica presence unspecified       Relevant Medications   traMADol (ULTRAM) 50 MG tablet   meloxicam (MOBIC) 7.5 MG tablet   Other Relevant Orders   Ambulatory referral to Pain Clinic   Chronic pain of right lower extremity       Relevant Medications   traMADol (ULTRAM) 50 MG tablet   meloxicam (MOBIC) 7.5 MG tablet   Other Relevant Orders   Ambulatory referral to Pain Clinic     Meds ordered this encounter  Medications  . sitaGLIPtin (JANUVIA) 100 MG tablet    Sig: Take 1 tablet (100 mg total) by mouth daily.    Dispense:  30 tablet    Refill:  2    Order Specific Question:   Supervising  Provider    Answer:   Tresa Garter [id number]  . metoprolol succinate (TOPROL-XL) 100 MG 24 hr tablet    Sig: Take 1 tablet (100 mg total) by mouth daily. Take with or immediately following a meal.    Dispense:  30 tablet    Refill:  3    Order Specific Question:   Supervising Provider    Answer:   Tresa Garter [id number]  . omeprazole (PRILOSEC) 40 MG capsule    Sig: Take 1 capsule (40 mg total) by mouth daily.    Dispense:  60 capsule  Refill:  0    Order Specific Question:   Supervising Provider    Answer:   Tresa Garter [id number]  . metFORMIN (GLUCOPHAGE) 1000 MG tablet    Sig: Take 1 tablet (1,000 mg total) by mouth 2 (two) times daily with a meal.    Dispense:  60 tablet    Refill:  2    Order Specific Question:   Supervising Provider    Answer:   Tresa Garter [id number]  . glipiZIDE (GLUCOTROL) 10 MG tablet    Sig: Take 1 tablet (10 mg total) by mouth daily before breakfast.    Dispense:  30 tablet    Refill:  2    Order Specific Question:   Supervising Provider    Answer:   Tresa Garter [id number]    Follow-up: No Follow-up on file.   Alfonse Spruce FNP

## 2016-01-12 NOTE — Progress Notes (Signed)
Patient is here for establish care  Patient has taking meds today & have eaten today

## 2016-01-13 ENCOUNTER — Telehealth: Payer: Self-pay

## 2016-01-13 ENCOUNTER — Other Ambulatory Visit: Payer: Self-pay | Admitting: Family Medicine

## 2016-01-13 DIAGNOSIS — E782 Mixed hyperlipidemia: Secondary | ICD-10-CM

## 2016-01-13 DIAGNOSIS — E785 Hyperlipidemia, unspecified: Secondary | ICD-10-CM | POA: Insufficient documentation

## 2016-01-13 LAB — LIPID PANEL
CHOL/HDL RATIO: 5.9 ratio — AB (ref <5.0)
Cholesterol: 159 mg/dL (ref <200)
HDL: 27 mg/dL — AB (ref >40)
LDL CALC: 83 mg/dL (ref <100)
TRIGLYCERIDES: 244 mg/dL — AB (ref <150)
VLDL: 49 mg/dL — ABNORMAL HIGH (ref <30)

## 2016-01-13 LAB — BASIC METABOLIC PANEL
BUN: 12 mg/dL (ref 7–25)
CHLORIDE: 107 mmol/L (ref 98–110)
CO2: 20 mmol/L (ref 20–31)
Calcium: 9.4 mg/dL (ref 8.6–10.3)
Creat: 1.03 mg/dL (ref 0.70–1.25)
GLUCOSE: 125 mg/dL — AB (ref 65–99)
POTASSIUM: 4 mmol/L (ref 3.5–5.3)
SODIUM: 141 mmol/L (ref 135–146)

## 2016-01-13 LAB — VITAMIN D 25 HYDROXY (VIT D DEFICIENCY, FRACTURES): VIT D 25 HYDROXY: 51 ng/mL (ref 30–100)

## 2016-01-13 LAB — PSA: PSA: <0.1 ng/mL (ref <=4.0)

## 2016-01-13 MED ORDER — ASPIRIN EC 81 MG PO TBEC: 81.0000 mg | DELAYED_RELEASE_TABLET | Freq: Every day | ORAL | 2 refills | Status: DC

## 2016-01-13 MED ORDER — ATORVASTATIN CALCIUM 20 MG PO TABS: 20.0000 mg | ORAL_TABLET | Freq: Every day | ORAL | 2 refills | Status: DC

## 2016-01-13 NOTE — Telephone Encounter (Signed)
CAM call to go over lab results no answer left a VM stating the reason of the call and to call us back whenever he can.

## 2016-01-13 NOTE — Telephone Encounter (Signed)
-----   Message from Alfonse Spruce, [place] sent at 01/13/2016 12:55 PM EST ----- -Labs were normal. -Cholesterol levels are high you have been prescribed atorvastin to help lower these levels. Will need to recheck labs in 3 months. You have also been prescribed aspirin to help decrease your risk of heart disease.  -Start eating a diet low in saturated fat. Limit your intake of fried foods, red meats, and whole milk. Begin exercising at least 3-5 times per week for at least 30 minutes. -PSA that screens for prostate cancer is normal.  -HgbA1c gives an average of your blood sugar levels over the last 3 months. Goal is to lower your HgbA1c levels to 7 or below.

## 2016-01-16 ENCOUNTER — Other Ambulatory Visit: Payer: Self-pay | Admitting: Family Medicine

## 2016-01-16 DIAGNOSIS — E782 Mixed hyperlipidemia: Secondary | ICD-10-CM

## 2016-01-17 ENCOUNTER — Encounter: Payer: Self-pay | Admitting: Family Medicine

## 2016-01-17 NOTE — Telephone Encounter (Signed)
Patient Verify DOB  Patient was aware and understood his lab results and that he has some new prescription sent out  Patient did not had any questions.

## 2016-01-25 ENCOUNTER — Ambulatory Visit (HOSPITAL_COMMUNITY)
Admission: RE | Admit: 2016-01-25 | Discharge: 2016-01-25 | Disposition: A | Discharge status: Home / Self Care | Payer: BLUE CROSS/BLUE SHIELD | Source: Ambulatory Visit | Attending: Family Medicine | Admitting: Family Medicine

## 2016-01-25 DIAGNOSIS — R200 Anesthesia of skin: Secondary | ICD-10-CM | POA: Diagnosis present

## 2016-01-31 ENCOUNTER — Telehealth: Payer: Self-pay

## 2016-01-31 NOTE — Telephone Encounter (Signed)
-----   Message from Alfonse Spruce, [place] sent at 01/30/2016  4:49 PM EST ----- X-ray normal no evidence of dislocation, fracture, or disc compression.

## 2016-01-31 NOTE — Telephone Encounter (Signed)
CMA call to go over x-rays results, but no answer, left a VM stating his results and if have any questions just feel free to call us back.

## 2016-01-31 NOTE — Telephone Encounter (Signed)
CMA call to go over x ray results  Patient Verify DOB  Patient was aware and understood

## 2016-02-16 ENCOUNTER — Encounter: Payer: Self-pay | Admitting: Physical Medicine & Rehabilitation

## 2016-02-21 ENCOUNTER — Other Ambulatory Visit: Payer: Self-pay | Admitting: Pharmacist

## 2016-02-21 ENCOUNTER — Other Ambulatory Visit: Payer: Self-pay | Admitting: Family Medicine

## 2016-02-21 DIAGNOSIS — I10 Essential (primary) hypertension: Secondary | ICD-10-CM

## 2016-02-21 DIAGNOSIS — E782 Mixed hyperlipidemia: Secondary | ICD-10-CM

## 2016-02-21 DIAGNOSIS — E119 Type 2 diabetes mellitus without complications: Secondary | ICD-10-CM

## 2016-02-21 MED ORDER — SITAGLIPTIN PHOSPHATE 100 MG PO TABS
100.0000 mg | ORAL_TABLET | Freq: Every day | ORAL | 0 refills | Status: DC
Start: 2016-02-21 — End: 2016-02-28

## 2016-02-21 MED ORDER — LEVOTHYROXINE SODIUM 125 MCG PO TABS: 125.0000 ug | ORAL_TABLET | Freq: Every day | ORAL | 0 refills | Status: DC

## 2016-02-21 MED ORDER — METFORMIN HCL 1000 MG PO TABS: 1000.0000 mg | ORAL_TABLET | Freq: Two times a day (BID) | ORAL | 0 refills | Status: DC

## 2016-02-21 MED ORDER — AMLODIPINE BESYLATE-VALSARTAN 10-320 MG PO TABS: 1.0000 | ORAL_TABLET | Freq: Every day | ORAL | 0 refills | Status: DC

## 2016-02-21 MED ORDER — ATORVASTATIN CALCIUM 20 MG PO TABS: 20.0000 mg | ORAL_TABLET | Freq: Every day | ORAL | 0 refills | Status: DC

## 2016-02-21 MED ORDER — GLIPIZIDE 10 MG PO TABS: 10.0000 mg | ORAL_TABLET | Freq: Every day | ORAL | 0 refills | Status: DC

## 2016-02-21 MED ORDER — METOPROLOL SUCCINATE ER 100 MG PO TB24: 100.0000 mg | ORAL_TABLET | Freq: Every day | ORAL | 0 refills | Status: DC

## 2016-02-22 ENCOUNTER — Telehealth: Payer: Self-pay | Admitting: Family Medicine

## 2016-02-22 NOTE — Telephone Encounter (Signed)
Patient called the office to speak with pharmacist to inform her that he would like his medication to be mailed to his house. Pt is requesting a 90 day supply. Pt's insurance is [employer] [phone]).   Thank you.

## 2016-02-22 NOTE — Telephone Encounter (Signed)
Medications sent to [facility] yesterday and it will take a few days for them to send it to him.

## 2016-02-28 ENCOUNTER — Other Ambulatory Visit: Payer: Self-pay | Admitting: Family Medicine

## 2016-02-28 DIAGNOSIS — E119 Type 2 diabetes mellitus without complications: Secondary | ICD-10-CM

## 2016-02-28 MED ORDER — SITAGLIPTIN PHOSPHATE 100 MG PO TABS: 100.0000 mg | ORAL_TABLET | Freq: Every day | ORAL | 0 refills | Status: DC

## 2016-03-01 ENCOUNTER — Encounter: Payer: Self-pay | Admitting: Physical Medicine & Rehabilitation

## 2016-03-01 ENCOUNTER — Encounter: Payer: Medicare HMO | Attending: Physical Medicine & Rehabilitation | Admitting: Physical Medicine & Rehabilitation

## 2016-03-01 VITALS — BP 115/83 | HR 86 | Resp 14

## 2016-03-01 DIAGNOSIS — Z5181 Encounter for therapeutic drug level monitoring: Secondary | ICD-10-CM | POA: Diagnosis not present

## 2016-03-01 DIAGNOSIS — M545 Low back pain: Secondary | ICD-10-CM | POA: Insufficient documentation

## 2016-03-01 DIAGNOSIS — E039 Hypothyroidism, unspecified: Secondary | ICD-10-CM | POA: Insufficient documentation

## 2016-03-01 DIAGNOSIS — G894 Chronic pain syndrome: Secondary | ICD-10-CM

## 2016-03-01 DIAGNOSIS — Z79899 Other long term (current) drug therapy: Secondary | ICD-10-CM

## 2016-03-01 DIAGNOSIS — Z9079 Acquired absence of other genital organ(s): Secondary | ICD-10-CM | POA: Insufficient documentation

## 2016-03-01 DIAGNOSIS — I129 Hypertensive chronic kidney disease with stage 1 through stage 4 chronic kidney disease, or unspecified chronic kidney disease: Secondary | ICD-10-CM | POA: Diagnosis not present

## 2016-03-01 DIAGNOSIS — E7800 Pure hypercholesterolemia, unspecified: Secondary | ICD-10-CM | POA: Diagnosis not present

## 2016-03-01 DIAGNOSIS — K219 Gastro-esophageal reflux disease without esophagitis: Secondary | ICD-10-CM | POA: Insufficient documentation

## 2016-03-01 DIAGNOSIS — Z8249 Family history of ischemic heart disease and other diseases of the circulatory system: Secondary | ICD-10-CM | POA: Insufficient documentation

## 2016-03-01 DIAGNOSIS — N189 Chronic kidney disease, unspecified: Secondary | ICD-10-CM | POA: Diagnosis not present

## 2016-03-01 DIAGNOSIS — Z809 Family history of malignant neoplasm, unspecified: Secondary | ICD-10-CM | POA: Insufficient documentation

## 2016-03-01 DIAGNOSIS — Z823 Family history of stroke: Secondary | ICD-10-CM | POA: Insufficient documentation

## 2016-03-01 DIAGNOSIS — Z8042 Family history of malignant neoplasm of prostate: Secondary | ICD-10-CM | POA: Diagnosis not present

## 2016-03-01 DIAGNOSIS — M791 Myalgia, unspecified site: Secondary | ICD-10-CM

## 2016-03-01 DIAGNOSIS — E1122 Type 2 diabetes mellitus with diabetic chronic kidney disease: Secondary | ICD-10-CM | POA: Insufficient documentation

## 2016-03-01 DIAGNOSIS — Z833 Family history of diabetes mellitus: Secondary | ICD-10-CM | POA: Diagnosis not present

## 2016-03-01 DIAGNOSIS — Z8546 Personal history of malignant neoplasm of prostate: Secondary | ICD-10-CM | POA: Diagnosis not present

## 2016-03-01 DIAGNOSIS — G479 Sleep disorder, unspecified: Secondary | ICD-10-CM | POA: Diagnosis not present

## 2016-03-01 DIAGNOSIS — Z8049 Family history of malignant neoplasm of other genital organs: Secondary | ICD-10-CM | POA: Insufficient documentation

## 2016-03-01 DIAGNOSIS — Z8269 Family history of other diseases of the musculoskeletal system and connective tissue: Secondary | ICD-10-CM | POA: Insufficient documentation

## 2016-03-01 DIAGNOSIS — G8929 Other chronic pain: Secondary | ICD-10-CM | POA: Diagnosis not present

## 2016-03-01 MED ORDER — IBUPROFEN 600 MG PO TABS: 600.0000 mg | ORAL_TABLET | Freq: Three times a day (TID) | ORAL | 1 refills | Status: AC | PRN

## 2016-03-01 MED ORDER — TRAMADOL HCL 50 MG PO TABS: 50.0000 mg | ORAL_TABLET | Freq: Two times a day (BID) | ORAL | 1 refills | Status: AC | PRN

## 2016-03-01 MED ORDER — METHOCARBAMOL 500 MG PO TABS: 500.0000 mg | ORAL_TABLET | Freq: Two times a day (BID) | ORAL | 1 refills | Status: AC | PRN

## 2016-03-01 NOTE — Progress Notes (Signed)
Subjective:    Patient ID: Johnny Smith, male    DOB: Jan 14, 1950, 66 y.o.   MRN: [id number]  HPI  66 y/o male with pmh of DM, prostate CA s/p resection presents with low back pain.  Started 02/2014.  No inciting event.  Stable since 12/2015.  Tramadol helps.  Cannot identify exacerbating events. Radiates to his lateral thigh at times. Intermittent.  Associated numbness. Denies associated weakness.  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  Denies falls.   Pain Inventory Average Pain 4 Pain Right Now 2 My pain is dull, tingling and other  In the last 24 hours, has pain interfered with the following? General activity 2 Relation with others 2 Enjoyment of life 2 What TIME of day is your pain at its worst? morning and night Sleep (in general) Fair  Pain is worse with: bending and standing Pain improves with: rest and medication Relief from Meds: not answered  Mobility walk without assistance ability to climb steps?  yes do you drive?  yes  Function employed # of hrs/week . 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  no  Physicians involved in your care Primary care Fredia Beets FNP    Family History  Problem Relation Age of Onset  . Stroke Mother   . Heart attack Father   . Prostate cancer Maternal Uncle   . Cancer Maternal Uncle   . Uterine cancer Paternal Grandmother   . Lupus Sister   . Diabetes Sister   . Cancer Maternal Grandfather    Social History   Social History  . Marital status: Single    Spouse name: N/A  . Number of children: 0  . Years of education: N/A   Occupational History  . musician    Social History Main Topics  . Smoking status: Never Smoker  . Smokeless tobacco: Never Used  . Alcohol use No  . Drug use: No  . Sexual activity: Not Asked   Other Topics Concern  . None   Social History Narrative  . None   Past Surgical History:  Procedure Laterality  Date  . APPENDECTOMY  1991  . COLONOSCOPY  2005   POLYP REMOVED  . LYMPHADENECTOMY Bilateral 03/10/2015   Procedure: LYMPHADENECTOMY;  Surgeon: Raynelle Bring, MD;  Location: [facility];  Service: Urology;  Laterality: Bilateral;  . PROSTATE BIOPSY  10/28/14  . ROBOT ASSISTED LAPAROSCOPIC RADICAL PROSTATECTOMY N/A 03/10/2015   Procedure: ROBOTIC ASSISTED LAPAROSCOPIC RADICAL PROSTATECTOMY LEVEL 2;  Surgeon: Raynelle Bring, MD;  Location: [facility];  Service: Urology;  Laterality: N/A;   Past Medical History:  Diagnosis Date  . Chronic renal insufficiency   . Diabetes mellitus without complication ([place])   . GERD (gastroesophageal reflux disease)   . High cholesterol   . Hypertension   . Hypothyroidism   . Pneumonia   . Prostate cancer ([place]) 10/28/2014   BP 115/83   Pulse 86   Resp 14   SpO2 98%   Opioid Risk Score:   Fall Risk Score:  `1  Depression screen PHQ 2/9  Depression screen [facility] 2/9 03/01/2016 01/12/2016 11/17/2014  Decreased Interest 0 0 0  Down, Depressed, Hopeless 0 0 0  PHQ - 2 Score 0 0 0  Altered sleeping 0 0 -  Tired, decreased energy 1 1 -  Change in appetite 0 1 -  Feeling bad or failure about yourself  0 0 -  Trouble concentrating  0 0 -  Moving slowly or fidgety/restless 0 0 -  Suicidal thoughts 0 0 -  PHQ-9 Score 1 2 -  Difficult doing work/chores Not difficult at all - -    Review of Systems  Constitutional: Negative.   HENT: Negative.   Eyes: Negative.   Respiratory: Negative.   Cardiovascular: Negative.   Gastrointestinal: Negative.   Endocrine: Negative.   Genitourinary: Negative.   Musculoskeletal: Positive for arthralgias and back pain.  Skin: Negative.   Neurological: Negative.   Hematological: Negative.   Psychiatric/Behavioral: Negative.   All other systems reviewed and are negative.     Objective:   Physical Exam Gen: NAD. Vital signs reviewed HENT: Normocephalic, Atraumatic Eyes: EOMI. No discharge.  Cardio: RRR. No JVD. Pulm: B/l  clear to auscultation.  Effort normal Abd: Soft, BS+ MSK:  Gait WNL.   Mild TTP right iliac crest.    No edema.  Neuro: CN II-XII grossly intact.    Sensation intact to light touch in all LE dermatomes  Reflexes 2+ throughout  Strength  5/5 in all LE myotomes  SLR neg Skin: Warm and Dry. Intact    Assessment & Plan:  66 y/o male with pmh of DM, prostate CA s/p resection presents with low back pain.    1. Chronic mechanical low back pain  XRAY from 01/2016 reviewed, uremarkable  Labs reviewed  Referral information reviewed  NCCSRS reviewed  UDS performed  Encouraged use of Heat/Cold PRN  Will order PT for eval and treat of back pain with stretching and ROM  Will order IBU 600 TID PRN  Will order Robaxin 500 BID PRN  Cont tramadol 50 BID PRN  Will consider Gabapentin  Will consider Cymbalta  Will consider TENS in future  Will consider Voltaren gel/Lidoderm   2. Sleep disturbance  Only during periods of exacerbation  Will consider meds as needed  3. Myalgia   Will consider Trigger point injections in future  See #1

## 2016-03-07 LAB — TOXASSURE SELECT,+ANTIDEPR,UR

## 2016-03-16 ENCOUNTER — Telehealth: Payer: Self-pay | Admitting: Family Medicine

## 2016-03-16 NOTE — Telephone Encounter (Signed)
Pt calling in regards to sitaGLIPtin (JANUVIA) 100 MG tablet Rx.  Pt states this medication is too expensive and would like to know if a cheaper alternative can be prescribed  Pt states he has a three months supply already so would not need alternative until June/July

## 2016-03-19 NOTE — Telephone Encounter (Signed)
Pt calling in regards to sitaGLIPtin (JANUVIA) 100 MG tablet Rx.  Pt states this medication is too expensive and would like to know if a cheaper alternative can be prescribed  Pt states he has a three months supply already so would not need alternative until June/July

## 2016-03-26 ENCOUNTER — Other Ambulatory Visit: Payer: Self-pay | Admitting: Pharmacist

## 2016-03-26 DIAGNOSIS — K219 Gastro-esophageal reflux disease without esophagitis: Secondary | ICD-10-CM

## 2016-03-26 MED ORDER — OMEPRAZOLE 40 MG PO CPDR: 40.0000 mg | DELAYED_RELEASE_CAPSULE | Freq: Every day | ORAL | 0 refills | Status: DC

## 2016-04-04 NOTE — Telephone Encounter (Signed)
Notify patient that alternative will be discussed and prescribed at next office visit. Recommend patient scheduling a follow up appointment for diabetes within the next few weeks.

## 2016-04-05 NOTE — Telephone Encounter (Signed)
CMA call patient to inform about alternatives to come for an office visit to discuss about the medication   Patient was aware and understood   CMA transfer patient to scheduler so they can make an appt

## 2016-04-11 ENCOUNTER — Ambulatory Visit: Payer: Medicare HMO | Attending: Family Medicine | Admitting: Family Medicine

## 2016-04-11 ENCOUNTER — Other Ambulatory Visit: Payer: Self-pay | Admitting: Family Medicine

## 2016-04-11 VITALS — BP 121/86 | HR 91 | Temp 97.6°F | Resp 18 | Ht 71.0 in | Wt 172.2 lb

## 2016-04-11 DIAGNOSIS — K219 Gastro-esophageal reflux disease without esophagitis: Secondary | ICD-10-CM | POA: Diagnosis not present

## 2016-04-11 DIAGNOSIS — E1165 Type 2 diabetes mellitus with hyperglycemia: Secondary | ICD-10-CM | POA: Diagnosis not present

## 2016-04-11 DIAGNOSIS — E039 Hypothyroidism, unspecified: Secondary | ICD-10-CM | POA: Diagnosis not present

## 2016-04-11 DIAGNOSIS — Z7984 Long term (current) use of oral hypoglycemic drugs: Secondary | ICD-10-CM | POA: Insufficient documentation

## 2016-04-11 DIAGNOSIS — E119 Type 2 diabetes mellitus without complications: Secondary | ICD-10-CM | POA: Diagnosis not present

## 2016-04-11 DIAGNOSIS — E785 Hyperlipidemia, unspecified: Secondary | ICD-10-CM | POA: Insufficient documentation

## 2016-04-11 DIAGNOSIS — Z7982 Long term (current) use of aspirin: Secondary | ICD-10-CM | POA: Insufficient documentation

## 2016-04-11 DIAGNOSIS — Z79899 Other long term (current) drug therapy: Secondary | ICD-10-CM

## 2016-04-11 DIAGNOSIS — I10 Essential (primary) hypertension: Secondary | ICD-10-CM | POA: Diagnosis not present

## 2016-04-11 DIAGNOSIS — E782 Mixed hyperlipidemia: Secondary | ICD-10-CM | POA: Diagnosis not present

## 2016-04-11 LAB — POCT GLYCOSYLATED HEMOGLOBIN (HGB A1C): Hemoglobin A1C: 9.6

## 2016-04-11 LAB — GLUCOSE, POCT (MANUAL RESULT ENTRY)
POC GLUCOSE: 285 mg/dL — AB (ref 70–99)
POC Glucose: 278 mg/dl — AB (ref 70–99)

## 2016-04-11 MED ORDER — ASPIRIN EC 81 MG PO TBEC: 81.0000 mg | DELAYED_RELEASE_TABLET | Freq: Every day | ORAL | 0 refills | Status: DC

## 2016-04-11 MED ORDER — METOPROLOL SUCCINATE ER 100 MG PO TB24: 100.0000 mg | ORAL_TABLET | Freq: Every day | ORAL | 0 refills | Status: DC

## 2016-04-11 MED ORDER — INSULIN ASPART 100 UNIT/ML ~~LOC~~ SOLN
10.0000 [IU] | Freq: Once | SUBCUTANEOUS | Status: AC
  Administered 2016-04-11: 10 [IU] via SUBCUTANEOUS

## 2016-04-11 MED ORDER — GLIPIZIDE 10 MG PO TABS: 10.0000 mg | ORAL_TABLET | Freq: Two times a day (BID) | ORAL | 0 refills | Status: DC

## 2016-04-11 MED ORDER — INSULIN ASPART 100 UNIT/ML ~~LOC~~ SOLN
2.0000 [IU] | Freq: Once | SUBCUTANEOUS | Status: AC
  Administered 2016-04-11: 2 [IU] via SUBCUTANEOUS

## 2016-04-11 MED ORDER — METFORMIN HCL 1000 MG PO TABS: 1000.0000 mg | ORAL_TABLET | Freq: Two times a day (BID) | ORAL | 0 refills | Status: DC

## 2016-04-11 MED ORDER — AMLODIPINE BESYLATE-VALSARTAN 10-320 MG PO TABS: 1.0000 | ORAL_TABLET | Freq: Every day | ORAL | 0 refills | Status: DC

## 2016-04-11 MED ORDER — OMEPRAZOLE 40 MG PO CPDR: 40.0000 mg | DELAYED_RELEASE_CAPSULE | Freq: Every day | ORAL | 0 refills | Status: DC

## 2016-04-11 NOTE — Progress Notes (Signed)
Patient is here for f/up DM  Patient denies pain for today  Patient has eaten for today  Patient has taking his medication for today

## 2016-04-11 NOTE — Progress Notes (Unsigned)
   Subjective:  Patient ID: Jaishawn Witzke, male    DOB: 05-Sep-1950  Age: 66 y.o. MRN: [id number]  CC: No chief complaint on file.   HPI Deron Poole presents for ***  Outpatient Medications Prior to Visit  Medication Sig Dispense Refill  . amLODipine-valsartan (EXFORGE) 10-320 MG tablet Take 1 tablet by mouth daily. 90 tablet 0  . aspirin EC 81 MG tablet Take 1 tablet (81 mg total) by mouth daily. 90 tablet 0  . atorvastatin (LIPITOR) 20 MG tablet Take 1 tablet (20 mg total) by mouth daily at 6 PM. 90 tablet 0  . ferrous sulfate 325 (65 FE) MG EC tablet Take 325 mg by mouth daily.    Marland Kitchen glipiZIDE (GLUCOTROL) 10 MG tablet Take 1 tablet (10 mg total) by mouth 2 (two) times daily before a meal. 180 tablet 0  . ibuprofen (ADVIL,MOTRIN) 600 MG tablet Take 1 tablet (600 mg total) by mouth 3 (three) times daily as needed. 90 tablet 1  . levothyroxine (SYNTHROID, LEVOTHROID) 125 MCG tablet Take 1 tablet (125 mcg total) by mouth daily before breakfast. 90 tablet 0  . metFORMIN (GLUCOPHAGE) 1000 MG tablet Take 1 tablet (1,000 mg total) by mouth 2 (two) times daily with a meal. 180 tablet 0  . methocarbamol (ROBAXIN) 500 MG tablet Take 1 tablet (500 mg total) by mouth 2 (two) times daily as needed for muscle spasms. 60 tablet 1  . metoprolol succinate (TOPROL-XL) 100 MG 24 hr tablet Take 1 tablet (100 mg total) by mouth daily. Take with or immediately following a meal. 90 tablet 0  . omeprazole (PRILOSEC) 40 MG capsule Take 1 capsule (40 mg total) by mouth daily. 90 capsule 0  . sitaGLIPtin (JANUVIA) 100 MG tablet Take 1 tablet (100 mg total) by mouth daily. 90 tablet 0  . traMADol (ULTRAM) 50 MG tablet Take 1 tablet (50 mg total) by mouth 2 (two) times daily as needed. 60 tablet 1   No facility-administered medications prior to visit.     ROS Review of Systems  Review of Systems - {ros [id number]}    Objective:  There were no vitals taken for this visit.  BP/Weight 04/11/2016 03/01/2016  02/11/1550  Systolic BP 080 223 361  Diastolic BP 86 83 84  Wt. (Lbs) 172.2 - 172.4  BMI 24.02 - 24.74     Physical Exam   Assessment & Plan:   Problem List Items Addressed This Visit    None      No orders of the defined types were placed in this encounter.   Follow-up: No Follow-up on file.   [place]

## 2016-04-11 NOTE — Patient Instructions (Addendum)
Schedule follow up with PCP in 3 months for Diabetes. Start taking blood sugars twice a day. Start blood sugar log to bring with you for follow up visit in 2 weeks.  You will be called with your labs results.  Diabetes Mellitus and Food It is important for you to manage your blood sugar (glucose) level. Your blood glucose level can be greatly affected by what you eat. Eating healthier foods in the appropriate amounts throughout the day at about the same time each day will help you control your blood glucose level. It can also help slow or prevent worsening of your diabetes mellitus. Healthy eating may even help you improve the level of your blood pressure and reach or maintain a healthy weight. General recommendations for healthful eating and cooking habits include:  Eating meals and snacks regularly. Avoid going long periods of time without eating to lose weight.  Eating a diet that consists mainly of plant-based foods, such as fruits, vegetables, nuts, legumes, and whole grains.  Using low-heat cooking methods, such as baking, instead of high-heat cooking methods, such as deep frying. Work with your dietitian to make sure you understand how to use the Nutrition Facts information on food labels. How can food affect me? Carbohydrates  Carbohydrates affect your blood glucose level more than any other type of food. Your dietitian will help you determine how many carbohydrates to eat at each meal and teach you how to count carbohydrates. Counting carbohydrates is important to keep your blood glucose at a healthy level, especially if you are using insulin or taking certain medicines for diabetes mellitus. Alcohol  Alcohol can cause sudden decreases in blood glucose (hypoglycemia), especially if you use insulin or take certain medicines for diabetes mellitus. Hypoglycemia can be a life-threatening condition. Symptoms of hypoglycemia (sleepiness, dizziness, and disorientation) are similar to symptoms of  having too much alcohol. If your health care provider has given you approval to drink alcohol, do so in moderation and use the following guidelines:  Women should not have more than one drink per day, and men should not have more than two drinks per day. One drink is equal to:  12 oz of beer.  5 oz of wine.  1 oz of hard liquor.  Do not drink on an empty stomach.  Keep yourself hydrated. Have water, diet soda, or unsweetened iced tea.  Regular soda, juice, and other mixers might contain a lot of carbohydrates and should be counted. What foods are not recommended? As you make food choices, it is important to remember that all foods are not the same. Some foods have fewer nutrients per serving than other foods, even though they might have the same number of calories or carbohydrates. It is difficult to get your body what it needs when you eat foods with fewer nutrients. Examples of foods that you should avoid that are high in calories and carbohydrates but low in nutrients include:  Trans fats (most processed foods list trans fats on the Nutrition Facts label).  Regular soda.  Juice.  Candy.  Sweets, such as cake, pie, doughnuts, and cookies.  Fried foods. What foods can I eat? Eat nutrient-rich foods, which will nourish your body and keep you healthy. The food you should eat also will depend on several factors, including:  The calories you need.  The medicines you take.  Your weight.  Your blood glucose level.  Your blood pressure level.  Your cholesterol level. You should eat a variety of foods, including:  Protein.  Lean cuts of meat.  Proteins low in saturated fats, such as fish, egg whites, and beans. Avoid processed meats.  Fruits and vegetables.  Fruits and vegetables that may help control blood glucose levels, such as apples, mangoes, and yams.  Dairy products.  Choose fat-free or low-fat dairy products, such as milk, yogurt, and cheese.  Grains,  bread, pasta, and rice.  Choose whole grain products, such as multigrain bread, whole oats, and brown rice. These foods may help control blood pressure.  Fats.  Foods containing healthful fats, such as nuts, avocado, olive oil, canola oil, and fish. Does everyone with diabetes mellitus have the same meal plan? Because every person with diabetes mellitus is different, there is not one meal plan that works for everyone. It is very important that you meet with a dietitian who will help you create a meal plan that is just right for you. This information is not intended to replace advice given to you by your health care provider. Make sure you discuss any questions you have with your health care provider. Document Released: 09/14/2004 Document Revised: 05/26/2015 Document Reviewed: 11/14/2012 Elsevier Interactive Patient Education  2017 [employer].

## 2016-04-11 NOTE — Progress Notes (Signed)
Subjective:  Patient ID: Johnny Smith, male    DOB: 12-29-1950  Age: 66 y.o. MRN: [id number]  CC: No chief complaint on file.   HPI Denton Derks presents for   DM: Reports taking CBG QD in the am. CBG range between 160-250. Request alternative option to Januvia due to medication cost. However, he does report having supply of Januvia available to last him until August. Denies any polydipsia, polyuria, paresthesias, or wounds. Reports last eye exam was Dec.2017.  HTN: Reports adherence with blood pressure medications. He denies any associated symptoms.    Outpatient Medications Prior to Visit  Medication Sig Dispense Refill  . atorvastatin (LIPITOR) 20 MG tablet Take 1 tablet (20 mg total) by mouth daily at 6 PM. 90 tablet 0  . ferrous sulfate 325 (65 FE) MG EC tablet Take 325 mg by mouth daily.    Marland Kitchen ibuprofen (ADVIL,MOTRIN) 600 MG tablet Take 1 tablet (600 mg total) by mouth 3 (three) times daily as needed. 90 tablet 1  . levothyroxine (SYNTHROID, LEVOTHROID) 125 MCG tablet Take 1 tablet (125 mcg total) by mouth daily before breakfast. 90 tablet 0  . methocarbamol (ROBAXIN) 500 MG tablet Take 1 tablet (500 mg total) by mouth 2 (two) times daily as needed for muscle spasms. 60 tablet 1  . sitaGLIPtin (JANUVIA) 100 MG tablet Take 1 tablet (100 mg total) by mouth daily. 90 tablet 0  . traMADol (ULTRAM) 50 MG tablet Take 1 tablet (50 mg total) by mouth 2 (two) times daily as needed. 60 tablet 1  . amLODipine-valsartan (EXFORGE) 10-320 MG tablet Take 1 tablet by mouth daily. 90 tablet 0  . aspirin EC 81 MG tablet Take 1 tablet (81 mg total) by mouth daily. 30 tablet 2  . glipiZIDE (GLUCOTROL) 10 MG tablet Take 1 tablet (10 mg total) by mouth daily before breakfast. 90 tablet 0  . metFORMIN (GLUCOPHAGE) 1000 MG tablet Take 1 tablet (1,000 mg total) by mouth 2 (two) times daily with a meal. 180 tablet 0  . metoCLOPramide (REGLAN) 10 MG tablet Take 10 mg by mouth every 6 (six) hours as needed  for nausea.    . metoprolol succinate (TOPROL-XL) 100 MG 24 hr tablet Take 1 tablet (100 mg total) by mouth daily. Take with or immediately following a meal. 90 tablet 0  . omeprazole (PRILOSEC) 40 MG capsule Take 1 capsule (40 mg total) by mouth daily. 90 capsule 0  . potassium chloride SA (K-DUR,KLOR-CON) 20 MEQ tablet Take 1 tablet (20 mEq total) by mouth daily. 30 tablet 3   No facility-administered medications prior to visit.     ROS Review of Systems  Constitutional: Negative.   Eyes: Negative.   Respiratory: Negative.   Cardiovascular: Negative.   Gastrointestinal: Negative.   Endocrine: Negative.   Musculoskeletal: Negative.   Skin: Negative.   Neurological: Negative.      Objective:  BP 121/86 (BP Location: Left Arm, Patient Position: Sitting, Cuff Size: Normal)   Pulse 91   Temp 97.6 F (36.4 C) (Oral)   Resp 18   Ht 5\' 11"  (1.803 m)   Wt 172 lb 3.2 oz (78.1 kg)   SpO2 99%   BMI 24.02 kg/m   BP/Weight 04/11/2016 03/01/2016 0/63/0160  Systolic BP 109 323 557  Diastolic BP 86 83 84  Wt. (Lbs) 172.2 - 172.4  BMI 24.02 - 24.74     Physical Exam  Constitutional: He appears well-developed and well-nourished.  Eyes: Conjunctivae are normal. Pupils are equal, round,  and reactive to light.  Wears corrective lenses.   Cardiovascular: Normal rate, regular rhythm, normal heart sounds and intact distal pulses.   Pulmonary/Chest: Effort normal and breath sounds normal.  Abdominal: Soft. Bowel sounds are normal.  Skin: Skin is warm and dry.  Nursing note and vitals reviewed.   Diabetic Foot Exam - Simple   Simple Foot Form Diabetic Foot exam was performed with the following findings:  Yes 04/11/2016  1:53 PM  Visual Inspection No deformities, no ulcerations, no other skin breakdown bilaterally:  Yes Sensation Testing Intact to touch and monofilament testing bilaterally:  Yes Pulse Check Posterior Tibialis and Dorsalis pulse intact bilaterally:  Yes Comments       Assessment & Plan:   Problem List Items Addressed This Visit      Cardiovascular and Mediastinum   HTN (hypertension)   -BP well controlled on current medications. No dose changes.    Relevant Medications   metoprolol succinate (TOPROL-XL) 100 MG 24 hr tablet   aspirin EC 81 MG tablet   amLODipine-valsartan (EXFORGE) 10-320 MG tablet     Digestive   GERD (gastroesophageal reflux disease)   Relevant Medications   omeprazole (PRILOSEC) 40 MG capsule     Endocrine   Hypothyroidism   Other Relevant Orders   TSH   Type 2 diabetes mellitus ([place]) - Primary   -Considering he already has a large supply of Januvia remaining, will hold switching to less expensive   option like pioglitazone (Actos) for now until.    Increased glucose monitoring from QD to BID, keep log to bring to next visit.   -Follow up with PCP in 3 months.   Relevant Medications   insulin aspart (novoLOG) injection 10 Units (Completed)   aspirin EC 81 MG tablet   metFORMIN (GLUCOPHAGE) 1000 MG tablet   glipiZIDE (GLUCOTROL) 10 MG tablet   insulin aspart (novoLOG) injection 2 Units (Completed)   Other Relevant Orders   POCT glycosylated hemoglobin (Hb A1C) (Completed)   Lipid Panel   Microalbumin/Creatinine Ratio, Urine   Glucose (CBG) (Completed)   Glucose (CBG) (Completed)   Glucose (CBG)     Other   Hyperlipidemia   Relevant Medications   metoprolol succinate (TOPROL-XL) 100 MG 24 hr tablet   aspirin EC 81 MG tablet   amLODipine-valsartan (EXFORGE) 10-320 MG tablet    Other Visit Diagnoses    On statin therapy       Relevant Orders   Lipid Panel   Hepatic Function Panel      Meds ordered this encounter  Medications  . insulin aspart (novoLOG) injection 10 Units  . metoprolol succinate (TOPROL-XL) 100 MG 24 hr tablet    Sig: Take 1 tablet (100 mg total) by mouth daily. Take with or immediately following a meal.    Dispense:  90 tablet    Refill:  0    Order Specific Question:    Supervising Provider    Answer:   Tresa Garter [id number]  . omeprazole (PRILOSEC) 40 MG capsule    Sig: Take 1 capsule (40 mg total) by mouth daily.    Dispense:  90 capsule    Refill:  0    Order Specific Question:   Supervising Provider    Answer:   Tresa Garter [id number]  . aspirin EC 81 MG tablet    Sig: Take 1 tablet (81 mg total) by mouth daily.    Dispense:  90 tablet    Refill:  0  Order Specific Question:   Supervising Provider    Answer:   Tresa Garter [id number]  . metFORMIN (GLUCOPHAGE) 1000 MG tablet    Sig: Take 1 tablet (1,000 mg total) by mouth 2 (two) times daily with a meal.    Dispense:  180 tablet    Refill:  0    Order Specific Question:   Supervising Provider    Answer:   Tresa Garter [id number]  . glipiZIDE (GLUCOTROL) 10 MG tablet    Sig: Take 1 tablet (10 mg total) by mouth 2 (two) times daily before a meal.    Dispense:  180 tablet    Refill:  0    Order Specific Question:   Supervising Provider    Answer:   Tresa Garter [id number]  . amLODipine-valsartan (EXFORGE) 10-320 MG tablet    Sig: Take 1 tablet by mouth daily.    Dispense:  90 tablet    Refill:  0    Order Specific Question:   Supervising Provider    Answer:   Tresa Garter [id number]  . insulin aspart (novoLOG) injection 2 Units    Follow-up: Return in about 2 weeks (around 04/25/2016) for Diabetes with clinical pharmacist.    Alfonse Spruce FNP

## 2016-04-12 ENCOUNTER — Encounter: Payer: Medicare HMO | Admitting: Physical Medicine & Rehabilitation

## 2016-04-12 LAB — HEPATIC FUNCTION PANEL
ALBUMIN: 4.1 g/dL (ref 3.6–4.8)
ALK PHOS: 86 IU/L (ref 39–117)
ALT: 16 IU/L (ref 0–44)
AST: 14 IU/L (ref 0–40)
Bilirubin Total: 0.2 mg/dL (ref 0.0–1.2)
Bilirubin, Direct: 0.09 mg/dL (ref 0.00–0.40)
TOTAL PROTEIN: 6.6 g/dL (ref 6.0–8.5)

## 2016-04-12 LAB — MICROALBUMIN / CREATININE URINE RATIO
CREATININE, UR: 231.2 mg/dL
MICROALBUM., U, RANDOM: 108.6 ug/mL
Microalb/Creat Ratio: 47 mg/g creat — ABNORMAL HIGH (ref 0.0–30.0)

## 2016-04-12 LAB — LIPID PANEL
CHOL/HDL RATIO: 4.5 ratio (ref 0.0–5.0)
Cholesterol, Total: 121 mg/dL (ref 100–199)
HDL: 27 mg/dL — ABNORMAL LOW (ref >39)
LDL CALC: 56 mg/dL (ref 0–99)
Triglycerides: 190 mg/dL — ABNORMAL HIGH (ref 0–149)
VLDL CHOLESTEROL CAL: 38 mg/dL (ref 5–40)

## 2016-04-12 LAB — TSH: TSH: 0.576 u[IU]/mL (ref 0.450–4.500)

## 2016-04-13 ENCOUNTER — Other Ambulatory Visit: Payer: Self-pay | Admitting: Family Medicine

## 2016-04-13 DIAGNOSIS — E782 Mixed hyperlipidemia: Secondary | ICD-10-CM

## 2016-04-13 DIAGNOSIS — E039 Hypothyroidism, unspecified: Secondary | ICD-10-CM

## 2016-04-13 MED ORDER — LEVOTHYROXINE SODIUM 125 MCG PO TABS: 125.0000 ug | ORAL_TABLET | Freq: Every day | ORAL | 0 refills | Status: DC

## 2016-04-13 MED ORDER — ATORVASTATIN CALCIUM 40 MG PO TABS: 40.0000 mg | ORAL_TABLET | Freq: Every day | ORAL | 0 refills | Status: DC

## 2016-04-16 ENCOUNTER — Telehealth: Payer: Self-pay

## 2016-04-16 NOTE — Telephone Encounter (Signed)
-----   Message from Alfonse Spruce, [place] sent at 04/13/2016  6:00 AM EDT ----- Microalbumin/creatinine ratio level was elevated. This tests for protein in your urine that can indicate early signs of kidney damage. You will be prescribed lisinopril to help lower risk. Recommend recheck in 3 months. Liver function normal Thyroid function normal. Levothyroxine will be refilled at current dosage.  Recommend recheck in 3 months.  Lipid levels were lower that previous tests but were still elevated. This can increase your risk of heart disease. Will increase dosage of atorvastatin. Recommend recheck in 3 months.

## 2016-04-16 NOTE — Telephone Encounter (Signed)
CMA call patient to inform about lab results  Patient Verify DOB  Patient was aware and understood the results

## 2016-04-20 ENCOUNTER — Telehealth: Payer: Self-pay | Admitting: *Deleted

## 2016-04-20 NOTE — Telephone Encounter (Signed)
Urine drug screen for this encounter is consistent for prescribed medication 

## 2016-04-24 ENCOUNTER — Other Ambulatory Visit: Payer: Self-pay | Admitting: Family Medicine

## 2016-04-24 ENCOUNTER — Telehealth: Payer: Self-pay | Admitting: Family Medicine

## 2016-04-24 ENCOUNTER — Telehealth: Payer: Self-pay

## 2016-04-24 ENCOUNTER — Ambulatory Visit: Payer: Medicare HMO | Attending: Family Medicine | Admitting: Pharmacist

## 2016-04-24 DIAGNOSIS — E119 Type 2 diabetes mellitus without complications: Secondary | ICD-10-CM | POA: Diagnosis not present

## 2016-04-24 DIAGNOSIS — E1165 Type 2 diabetes mellitus with hyperglycemia: Secondary | ICD-10-CM

## 2016-04-24 DIAGNOSIS — Z7984 Long term (current) use of oral hypoglycemic drugs: Secondary | ICD-10-CM | POA: Diagnosis not present

## 2016-04-24 MED ORDER — PIOGLITAZONE HCL 15 MG PO TABS: 15.0000 mg | ORAL_TABLET | Freq: Every day | ORAL | 2 refills | Status: DC

## 2016-04-24 NOTE — Patient Instructions (Addendum)
Thanks for coming to see [place]  Start pioglitazone 15 mg daily.   Come back in 1 month to see me   Carbohydrate Counting for Diabetes Mellitus, Adult Carbohydrate counting is a method for keeping track of how many carbohydrates you eat. Eating carbohydrates naturally increases the amount of sugar (glucose) in the blood. Counting how many carbohydrates you eat helps keep your blood glucose within normal limits, which helps you manage your diabetes (diabetes mellitus). It is important to know how many carbohydrates you can safely have in each meal. This is different for every person. A diet and nutrition specialist (registered dietitian) can help you make a meal plan and calculate how many carbohydrates you should have at each meal and snack. Carbohydrates are found in the following foods:  Grains, such as breads and cereals.  Dried beans and soy products.  Starchy vegetables, such as potatoes, peas, and corn.  Fruit and fruit juices.  Milk and yogurt.  Sweets and snack foods, such as cake, cookies, candy, chips, and soft drinks. How do I count carbohydrates? There are two ways to count carbohydrates in food. You can use either of the methods or a combination of both. Reading "Nutrition Facts" on packaged food  The "Nutrition Facts" list is included on the labels of almost all packaged foods and beverages in the [place] It includes:  The serving size.  Information about nutrients in each serving, including the grams (g) of carbohydrate per serving. To use the "Nutrition Facts":  Decide how many servings you will have.  Multiply the number of servings by the number of carbohydrates per serving.  The resulting number is the total amount of carbohydrates that you will be having. Learning standard serving sizes of other foods  When you eat foods containing carbohydrates that are not packaged or do not include "Nutrition Facts" on the label, you need to measure the servings in order to  count the amount of carbohydrates:  Measure the foods that you will eat with a food scale or measuring cup, if needed.  Decide how many standard-size servings you will eat.  Multiply the number of servings by 15. Most carbohydrate-rich foods have about 15 g of carbohydrates per serving.  For example, if you eat 8 oz (170 g) of strawberries, you will have eaten 2 servings and 30 g of carbohydrates (2 servings x 15 g = 30 g).  For foods that have more than one food mixed, such as soups and casseroles, you must count the carbohydrates in each food that is included. The following list contains standard serving sizes of common carbohydrate-rich foods. Each of these servings has about 15 g of carbohydrates:   hamburger bun or  English muffin.   oz (15 mL) syrup.   oz (14 g) jelly.  1 slice of bread.  1 six-inch tortilla.  3 oz (85 g) cooked rice or pasta.  4 oz (113 g) cooked dried beans.  4 oz (113 g) starchy vegetable, such as peas, corn, or potatoes.  4 oz (113 g) hot cereal.  4 oz (113 g) mashed potatoes or  of a large baked potato.  4 oz (113 g) canned or frozen fruit.  4 oz (120 mL) fruit juice.  4-6 crackers.  6 chicken nuggets.  6 oz (170 g) unsweetened dry cereal.  6 oz (170 g) plain fat-free yogurt or yogurt sweetened with artificial sweeteners.  8 oz (240 mL) milk.  8 oz (170 g) fresh fruit or one small piece of  fruit.  24 oz (680 g) popped popcorn. Example of carbohydrate counting Sample meal   3 oz (85 g) chicken breast.  6 oz (170 g) brown rice.  4 oz (113 g) corn.  8 oz (240 mL) milk.  8 oz (170 g) strawberries with sugar-free whipped topping. Carbohydrate calculation  1. Identify the foods that contain carbohydrates:  Rice.  Corn.  Milk.  Strawberries. 2. Calculate how many servings you have of each food:  2 servings rice.  1 serving corn.  1 serving milk.  1 serving strawberries. 3. Multiply each number of servings by 15  g:  2 servings rice x 15 g = 30 g.  1 serving corn x 15 g = 15 g.  1 serving milk x 15 g = 15 g.  1 serving strawberries x 15 g = 15 g. 4. Add together all of the amounts to find the total grams of carbohydrates eaten:  30 g + 15 g + 15 g + 15 g = 75 g of carbohydrates total. This information is not intended to replace advice given to you by your health care provider. Make sure you discuss any questions you have with your health care provider. Document Released: 12/18/2004 Document Revised: 07/08/2015 Document Reviewed: 06/01/2015 Elsevier Interactive Patient Education  2017 [employer].

## 2016-04-24 NOTE — Telephone Encounter (Signed)
Sent message to CMA to call patient to follow up.

## 2016-04-24 NOTE — Progress Notes (Signed)
S:  No chief complaint on file.   Patient arrives in good spirits.  Presents for diabetes evaluation, education, and management at the request of Fredia Beets. Patient was referred on 04/11/16.  Patient was last seen by Primary Care Provider on 04/11/16.    Patient reports adherence with medications.  Current diabetes medications include: sitagliptin 100mg  daily, glipizide 10mg  daily, metformin 1000mg  BID  Patient reports that he will be switching sitagliptin to actos in August due to cost.   Patient denies hypoglycemic episodes. 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 He eats two meals a day - cereal for breakfast and sandwiches for dinner. Patient reported exercise habits: going up and down stairs, has a bicycle that he hopes to start riding soon.  Patient reports nocturia - 2x per night  Patient denies neuropathy. Patient denies visual changes. Patient reports self foot exams.  O:   Home fasting CBG: 146-300 2 hour post-prandial/random CBG: 186-310   Last A1c: 9.6 (04/11/16) A/P:  Diabetes currently UNcontrolled. Patient denies hypoglycemic events and is able to verbalize appropriate hypoglycemia management plan. Patient reports adherence with medication. Control is suboptimal due to dietary indiscretion. Patient medications options are restricted due to insurance formulary and cost. Initiate pioglitazone 15 mg daily, continue sitagliptin 100 mg daily, glipizide 10 mg daily, metformin 1000 mg BID. Patient would prefer to not add insulin at this time.    Next A1C anticipated July 2018.     Written patient instructions provided. Total time in face to face counseling 30 minutes.   Follow up in Pharmacist Clinic Visit in one month. Patient seen with Maryan Char, PharmD Candidate.

## 2016-04-24 NOTE — Telephone Encounter (Signed)
Pt. Called requesting a refill on Potassium and Simvastatin. Pt. Was told that the medication had been discountinued. Pt. States he gets him medications delivered. Please f/u

## 2016-04-24 NOTE — Telephone Encounter (Signed)
-----   Message from Alfonse Spruce, [place] sent at 04/24/2016 12:57 PM EDT ----- Regarding: Medication follow up Please call patient to follow up with his request for potassium chloride 20 meq supplements. His most recent labs show that his potassium levels are normal. Therefore order for potassium supplements will not be ordered because it is not needed at this time. Simvastatin was discontinued because he was placed on atorvastatin to better control his cholesterol levels.

## 2016-04-24 NOTE — Telephone Encounter (Signed)
CMA call patient regarding medication f/up  Patient did not answer but left a detailed message with the information & if have any questions just to call back

## 2016-04-24 NOTE — Addendum Note (Signed)
Addended by: Rica Mast on: 04/24/2016 03:20 PM   Modules accepted: Orders

## 2016-04-24 NOTE — Telephone Encounter (Signed)
Will forward request to PCP

## 2016-04-25 NOTE — Telephone Encounter (Signed)
Patient return CMA call   CMA f/up with his medication  Patient was aware and understood

## 2016-04-25 NOTE — Telephone Encounter (Signed)
CMA second call attempt to patient   Patient did not answer but left a VM stating the reason of the call & to call back

## 2016-05-02 ENCOUNTER — Other Ambulatory Visit: Payer: Self-pay | Admitting: Family Medicine

## 2016-05-02 ENCOUNTER — Telehealth: Payer: Self-pay | Admitting: Family Medicine

## 2016-05-02 NOTE — Telephone Encounter (Signed)
I did not prescribe this medication. It was prescribed by Dr.Patel.  Refill for narcotic medication will not be given without office visit first, or he could contact Dr.Patel's office and request refill.

## 2016-05-02 NOTE — Telephone Encounter (Signed)
Patient called the office to request medication refill for traMADol (ULTRAM) 50 MG tablet. Pt asked that we fax request to [phone]. Explained to patient that this is a control substance that he may have to come to pick up the prescription.  Thank you

## 2016-05-02 NOTE — Telephone Encounter (Signed)
CMA call patient regarding med refill  Patient Verify DOB  Patient understood that mandiesa did not prescribe him tramadol so he needs to reach out that doctor who prescribe him that

## 2016-05-09 ENCOUNTER — Encounter: Payer: Medicare HMO | Admitting: Physical Medicine & Rehabilitation

## 2016-05-10 ENCOUNTER — Encounter: Payer: Medicare HMO | Attending: Physical Medicine & Rehabilitation | Admitting: Physical Medicine & Rehabilitation

## 2016-05-10 ENCOUNTER — Encounter: Payer: Self-pay | Admitting: Physical Medicine & Rehabilitation

## 2016-05-10 VITALS — BP 154/96 | HR 81 | Resp 14

## 2016-05-10 DIAGNOSIS — E1122 Type 2 diabetes mellitus with diabetic chronic kidney disease: Secondary | ICD-10-CM | POA: Insufficient documentation

## 2016-05-10 DIAGNOSIS — Z8546 Personal history of malignant neoplasm of prostate: Secondary | ICD-10-CM | POA: Insufficient documentation

## 2016-05-10 DIAGNOSIS — M545 Low back pain: Secondary | ICD-10-CM | POA: Diagnosis not present

## 2016-05-10 DIAGNOSIS — Z8269 Family history of other diseases of the musculoskeletal system and connective tissue: Secondary | ICD-10-CM | POA: Diagnosis not present

## 2016-05-10 DIAGNOSIS — K219 Gastro-esophageal reflux disease without esophagitis: Secondary | ICD-10-CM | POA: Insufficient documentation

## 2016-05-10 DIAGNOSIS — I129 Hypertensive chronic kidney disease with stage 1 through stage 4 chronic kidney disease, or unspecified chronic kidney disease: Secondary | ICD-10-CM | POA: Insufficient documentation

## 2016-05-10 DIAGNOSIS — Z5181 Encounter for therapeutic drug level monitoring: Secondary | ICD-10-CM | POA: Insufficient documentation

## 2016-05-10 DIAGNOSIS — G894 Chronic pain syndrome: Secondary | ICD-10-CM

## 2016-05-10 DIAGNOSIS — Z809 Family history of malignant neoplasm, unspecified: Secondary | ICD-10-CM | POA: Diagnosis not present

## 2016-05-10 DIAGNOSIS — M791 Myalgia, unspecified site: Secondary | ICD-10-CM

## 2016-05-10 DIAGNOSIS — E7800 Pure hypercholesterolemia, unspecified: Secondary | ICD-10-CM | POA: Diagnosis not present

## 2016-05-10 DIAGNOSIS — Z9079 Acquired absence of other genital organ(s): Secondary | ICD-10-CM | POA: Diagnosis not present

## 2016-05-10 DIAGNOSIS — N189 Chronic kidney disease, unspecified: Secondary | ICD-10-CM | POA: Insufficient documentation

## 2016-05-10 DIAGNOSIS — M7062 Trochanteric bursitis, left hip: Secondary | ICD-10-CM

## 2016-05-10 DIAGNOSIS — G8929 Other chronic pain: Secondary | ICD-10-CM

## 2016-05-10 DIAGNOSIS — E039 Hypothyroidism, unspecified: Secondary | ICD-10-CM | POA: Diagnosis not present

## 2016-05-10 DIAGNOSIS — Z8042 Family history of malignant neoplasm of prostate: Secondary | ICD-10-CM | POA: Diagnosis not present

## 2016-05-10 DIAGNOSIS — Z823 Family history of stroke: Secondary | ICD-10-CM | POA: Insufficient documentation

## 2016-05-10 DIAGNOSIS — Z8049 Family history of malignant neoplasm of other genital organs: Secondary | ICD-10-CM | POA: Insufficient documentation

## 2016-05-10 DIAGNOSIS — Z833 Family history of diabetes mellitus: Secondary | ICD-10-CM | POA: Diagnosis not present

## 2016-05-10 DIAGNOSIS — G479 Sleep disorder, unspecified: Secondary | ICD-10-CM | POA: Insufficient documentation

## 2016-05-10 DIAGNOSIS — Z8249 Family history of ischemic heart disease and other diseases of the circulatory system: Secondary | ICD-10-CM | POA: Diagnosis not present

## 2016-05-10 MED ORDER — DULOXETINE HCL 30 MG PO CPEP: 30.0000 mg | ORAL_CAPSULE | Freq: Every day | ORAL | 1 refills | Status: AC

## 2016-05-10 NOTE — Progress Notes (Signed)
Subjective:    Patient ID: Johnny Smith, male    DOB: [birth year], 66 y.o.   MRN: [id number]  HPI  66 y/o male with pmh of DM, prostate CA s/p resection presents for follow up of pain pain.   Initially stated: Started 02/2014.  No inciting event.  Stable since 12/2015.  Tramadol helps.  Cannot identify exacerbating events. Radiates to his lateral thigh at times. Intermittent.  Associated numbness. Denies associated weakness.  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  Denies falls.   Last clinic visit 03/01/16. At that time PT was ordered, but pt states he did not go because he felt better.  He is taking IBU and Robaxin without benefit.  Tramadol not effective lately.  Pt notes pain in his left thigh in the last 3-4 days. Denies inciting events.  Similar to right thigh pain. Located left lateral hip radiating to lateral knee.   Pain Inventory Average Pain 4 Pain Right Now 2 My pain is dull, tingling and other  In the last 24 hours, has pain interfered with the following? General activity 4 Relation with others 4 Enjoyment of life 7 What TIME of day is your pain at its worst? morning, night Sleep (in general) Poor  Pain is worse with: walking and standing Pain improves with: rest and pacing activities Relief from Meds: 2  Mobility walk without assistance ability to climb steps?  yes do you drive?  yes  Function employed # of hrs/week . 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  no  Physicians involved in your care Primary care Fredia Beets FNP    Family History  Problem Relation Age of Onset  . Stroke Mother   . Heart attack Father   . Prostate cancer Maternal Uncle   . Cancer Maternal Uncle   . Uterine cancer Paternal Grandmother   . Lupus Sister   . Diabetes Sister   . Cancer Maternal Grandfather    Social History   Social History  . Marital status: Single    Spouse name: N/A  .  Number of children: 0  . Years of education: N/A   Occupational History  . musician    Social History Main Topics  . Smoking status: Never Smoker  . Smokeless tobacco: Never Used  . Alcohol use No  . Drug use: No  . Sexual activity: Not Asked   Other Topics Concern  . None   Social History Narrative  . None   Past Surgical History:  Procedure Laterality Date  . APPENDECTOMY  1991  . COLONOSCOPY  2005   POLYP REMOVED  . LYMPHADENECTOMY Bilateral 03/10/2015   Procedure: LYMPHADENECTOMY;  Surgeon: Raynelle Bring, MD;  Location: [facility];  Service: Urology;  Laterality: Bilateral;  . PROSTATE BIOPSY  10/28/14  . ROBOT ASSISTED LAPAROSCOPIC RADICAL PROSTATECTOMY N/A 03/10/2015   Procedure: ROBOTIC ASSISTED LAPAROSCOPIC RADICAL PROSTATECTOMY LEVEL 2;  Surgeon: Raynelle Bring, MD;  Location: [facility];  Service: Urology;  Laterality: N/A;   Past Medical History:  Diagnosis Date  . Chronic renal insufficiency   . Diabetes mellitus without complication ([place])   . GERD (gastroesophageal reflux disease)   . High cholesterol   . Hypertension   . Hypothyroidism   . Pneumonia   . Prostate cancer ([place]) 10/28/2014   BP (!) 154/96 (BP Location: Right Arm, Patient Position: Sitting, Cuff Size: Normal)   Pulse 81   Resp 14  SpO2 95%   Opioid Risk Score:   Fall Risk Score:  `1  Depression screen PHQ 2/9  Depression screen [facility] 2/9 04/11/2016 03/01/2016 01/12/2016 11/17/2014  Decreased Interest 0 0 0 0  Down, Depressed, Hopeless 0 0 0 0  PHQ - 2 Score 0 0 0 0  Altered sleeping - 0 0 -  Tired, decreased energy 0 1 1 -  Change in appetite 0 0 1 -  Feeling bad or failure about yourself  0 0 0 -  Trouble concentrating 0 0 0 -  Moving slowly or fidgety/restless 0 0 0 -  Suicidal thoughts 0 0 0 -  PHQ-9 Score - 1 2 -  Difficult doing work/chores - Not difficult at all - -    Review of Systems  Constitutional: Negative.   HENT: Negative.   Eyes: Negative.   Respiratory: Negative.     Cardiovascular: Negative.   Gastrointestinal: Negative.   Endocrine: Negative.   Genitourinary: Negative.   Musculoskeletal: Positive for arthralgias and back pain.  Skin: Negative.   Neurological: Negative.   Hematological: Negative.   Psychiatric/Behavioral: Negative.   All other systems reviewed and are negative.     Objective:   Physical Exam Gen: NAD. Vital signs reviewed HENT: Normocephalic, Atraumatic Eyes: EOMI. No discharge.  Cardio: RRR. No JVD. Pulm: B/l clear to auscultation.  Effort normal  Abd: Soft, BS+ MSK:  Gait WNL.   TTP left greater trochanteric area.    No edema.  Neuro:   Sensation intact to light touch in all LE dermatomes  Strength  5/5 in all LE myotomes Skin: Warm and Dry. Intact    Assessment & Plan:  66 y/o male with pmh of DM, prostate CA s/p resection presents for follow up of pain.    1. Chronic mechanical low back pain  XRAY from 01/2016 reviewed, uremarkable  UDS reviewed, consistent  Encouraged use of Heat/Cold PRN again  Will order PT for eval and treat of back pain with stretching and ROM, pt did not follow up  Cont IBU 600 TID PRN  Cont Robaxin 500 BID PRN  Cont tramadol 50 BID PRN (educated on signs/symptoms of serotonin syndrome)  Will order Cymbalta  Will consider Gabapentin  Will consider TENS in future  Will consider Voltaren gel/Lidoderm   2. Sleep disturbance  Only during periods of exacerbation  Will consider meds as needed  3. Myalgia   Will consider Trigger point injections in future  See #1  4. Left greater trochanteric syndrome  See #1  Will schedule for steroid injection

## 2016-05-17 ENCOUNTER — Encounter (HOSPITAL_BASED_OUTPATIENT_CLINIC_OR_DEPARTMENT_OTHER): Payer: Medicare HMO | Admitting: Physical Medicine & Rehabilitation

## 2016-05-17 ENCOUNTER — Encounter: Payer: Self-pay | Admitting: Physical Medicine & Rehabilitation

## 2016-05-17 VITALS — BP 154/98 | HR 77

## 2016-05-17 DIAGNOSIS — N189 Chronic kidney disease, unspecified: Secondary | ICD-10-CM | POA: Diagnosis not present

## 2016-05-17 DIAGNOSIS — M791 Myalgia: Secondary | ICD-10-CM | POA: Diagnosis not present

## 2016-05-17 DIAGNOSIS — I129 Hypertensive chronic kidney disease with stage 1 through stage 4 chronic kidney disease, or unspecified chronic kidney disease: Secondary | ICD-10-CM | POA: Diagnosis not present

## 2016-05-17 DIAGNOSIS — M7062 Trochanteric bursitis, left hip: Secondary | ICD-10-CM

## 2016-05-17 DIAGNOSIS — Z5181 Encounter for therapeutic drug level monitoring: Secondary | ICD-10-CM | POA: Diagnosis not present

## 2016-05-17 DIAGNOSIS — G8929 Other chronic pain: Secondary | ICD-10-CM | POA: Diagnosis not present

## 2016-05-17 DIAGNOSIS — E7800 Pure hypercholesterolemia, unspecified: Secondary | ICD-10-CM | POA: Diagnosis not present

## 2016-05-17 DIAGNOSIS — E1122 Type 2 diabetes mellitus with diabetic chronic kidney disease: Secondary | ICD-10-CM | POA: Diagnosis not present

## 2016-05-17 DIAGNOSIS — K219 Gastro-esophageal reflux disease without esophagitis: Secondary | ICD-10-CM | POA: Diagnosis not present

## 2016-05-17 DIAGNOSIS — G479 Sleep disorder, unspecified: Secondary | ICD-10-CM | POA: Diagnosis not present

## 2016-05-17 DIAGNOSIS — M545 Low back pain: Secondary | ICD-10-CM | POA: Diagnosis not present

## 2016-05-17 NOTE — Progress Notes (Signed)
Left greater trochanteric bursa injection  Indication: Trochanteric bursa syndrome not relieved by medication management and other conservative care.  Informed consent was obtained after describing risks and benefits of the procedure with the patient, this includes bleeding, bruising, infection and medication side effects. The patient wishes to proceed and has given written consent. Patient was placed in a seated position. The left greater trochanteric area was marked and prepped with betadine at the point of maximal tenderness. Vapocoolant spray applied. A 25-gauge 1-1/2 inch needle was inserted into the greater trochanteric area until bone was felt.  Needle was drawn back and after negative draw back for blood, a 5cc solution containing 0.5 mL of 6 mg per ML celestone and 4.5 mL of 1% lidocaine was injected. A band aid was applied. The patient tolerated the procedure well. Post procedure instructions were given.

## 2016-05-24 ENCOUNTER — Ambulatory Visit: Payer: Medicare HMO | Admitting: Physical Medicine & Rehabilitation

## 2016-05-30 ENCOUNTER — Ambulatory Visit: Payer: Medicare HMO | Attending: Family Medicine | Admitting: Family Medicine

## 2016-05-30 ENCOUNTER — Encounter: Payer: Self-pay | Admitting: Family Medicine

## 2016-05-30 VITALS — BP 148/92 | HR 79 | Temp 98.2°F | Resp 18 | Ht 71.0 in | Wt 172.0 lb

## 2016-05-30 DIAGNOSIS — Z7984 Long term (current) use of oral hypoglycemic drugs: Secondary | ICD-10-CM | POA: Insufficient documentation

## 2016-05-30 DIAGNOSIS — I10 Essential (primary) hypertension: Secondary | ICD-10-CM | POA: Diagnosis not present

## 2016-05-30 DIAGNOSIS — Z79899 Other long term (current) drug therapy: Secondary | ICD-10-CM | POA: Insufficient documentation

## 2016-05-30 DIAGNOSIS — E119 Type 2 diabetes mellitus without complications: Secondary | ICD-10-CM | POA: Insufficient documentation

## 2016-05-30 DIAGNOSIS — E1100 Type 2 diabetes mellitus with hyperosmolarity without nonketotic hyperglycemic-hyperosmolar coma (NKHHC): Secondary | ICD-10-CM

## 2016-05-30 DIAGNOSIS — Z7982 Long term (current) use of aspirin: Secondary | ICD-10-CM | POA: Diagnosis not present

## 2016-05-30 LAB — GLUCOSE, POCT (MANUAL RESULT ENTRY): POC Glucose: 143 mg/dl — AB (ref 70–99)

## 2016-05-30 MED ORDER — HYDROCHLOROTHIAZIDE 12.5 MG PO TABS: 12.5000 mg | ORAL_TABLET | Freq: Every day | ORAL | 2 refills | Status: DC

## 2016-05-30 NOTE — Progress Notes (Signed)
Subjective:  Patient ID: Johnny Smith, male    DOB: 26-Apr-1950  Age: 66 y.o. MRN: [id number]  CC: Hypertension and Diabetes   HPI [name] presents for   Diabetes Mellitus: Patient presents for follow up of diabetes. Symptoms: none. Patient denies foot ulcerations, nausea, paresthesia of the feet, visual disturbances and vomitting.  Evaluation to date has been included: fasting blood sugar, fasting lipid panel, hemoglobin A1C and microalbuminuria.  Home sugars: He reports taking his blood glucose BID. BGs range between 120-140's in the am and 140-180's in the pm.Treatment to date: Continued sulfonylurea which has been effective, Continued metformin which has been effective and DDP inhibitors wihch has been effective..   Hypertension: Patient here for follow-up of elevated blood pressure. He is not exercising and is adherent to low salt diet.  Blood pressure He reports not checkin BP at home  .Cardiac symptoms none. Patient denies chest pain, claudication, dyspnea, lower extremity edema, near-syncope, palpitations and syncope.  Cardiovascular risk factors: advanced age (older than 104 for men, 22 for women), diabetes mellitus, dyslipidemia, hypertension, male gender and sedentary lifestyle. Use of agents associated with hypertension: none. History of target organ damage: none.   Outpatient Medications Prior to Visit  Medication Sig Dispense Refill  . amLODipine-valsartan (EXFORGE) 10-320 MG tablet Take 1 tablet by mouth daily. 90 tablet 0  . aspirin EC 81 MG tablet Take 1 tablet (81 mg total) by mouth daily. 90 tablet 0  . atorvastatin (LIPITOR) 40 MG tablet Take 1 tablet (40 mg total) by mouth daily. 90 tablet 0  . DULoxetine (CYMBALTA) 30 MG capsule Take 1 capsule (30 mg total) by mouth daily. 30 capsule 1  . ferrous sulfate 325 (65 FE) MG EC tablet Take 325 mg by mouth daily.    Marland Kitchen glipiZIDE (GLUCOTROL) 10 MG tablet Take 1 tablet (10 mg total) by mouth 2 (two) times daily before a meal.  180 tablet 0  . ibuprofen (ADVIL,MOTRIN) 600 MG tablet Take 1 tablet (600 mg total) by mouth 3 (three) times daily as needed. 90 tablet 1  . levothyroxine (SYNTHROID, LEVOTHROID) 125 MCG tablet Take 1 tablet (125 mcg total) by mouth daily before breakfast. 90 tablet 0  . metFORMIN (GLUCOPHAGE) 1000 MG tablet Take 1 tablet (1,000 mg total) by mouth 2 (two) times daily with a meal. 180 tablet 0  . methocarbamol (ROBAXIN) 500 MG tablet Take 1 tablet (500 mg total) by mouth 2 (two) times daily as needed for muscle spasms. 60 tablet 1  . metoprolol succinate (TOPROL-XL) 100 MG 24 hr tablet Take 1 tablet (100 mg total) by mouth daily. Take with or immediately following a meal. 90 tablet 0  . omeprazole (PRILOSEC) 40 MG capsule Take 1 capsule (40 mg total) by mouth daily. 90 capsule 0  . pioglitazone (ACTOS) 15 MG tablet Take 1 tablet (15 mg total) by mouth daily. 30 tablet 2  . sitaGLIPtin (JANUVIA) 100 MG tablet Take 1 tablet (100 mg total) by mouth daily. 90 tablet 0  . traMADol (ULTRAM) 50 MG tablet Take 1 tablet (50 mg total) by mouth 2 (two) times daily as needed. 60 tablet 1   No facility-administered medications prior to visit.     ROS Review of Systems  Constitutional: Negative.   Eyes: Negative.   Respiratory: Negative.   Cardiovascular: Negative.   Gastrointestinal: Negative.   Skin: Negative.   Neurological: Negative.    Objective:  BP (!) 148/92 (BP Location: Left Arm, Patient Position: Sitting, Cuff Size: Normal)  Pulse 79   Temp 98.2 F (36.8 C) (Oral)   Resp 18   Ht 5\' 11"  (1.803 m)   Wt 172 lb (78 kg)   SpO2 97%   BMI 23.99 kg/m   BP/Weight 05/30/2016 05/17/2016 05/18/6158  Systolic BP 737 106 269  Diastolic BP 92 98 96  Wt. (Lbs) 172 - -  BMI 23.99 - -    Physical Exam  Constitutional: He is oriented to person, place, and time. He appears well-developed and well-nourished.  Eyes: Conjunctivae are normal. Pupils are equal, round, and reactive to light.  Neck: No  JVD present.  Cardiovascular: Normal rate, regular rhythm, normal heart sounds and intact distal pulses.   Pulmonary/Chest: Effort normal and breath sounds normal.  Abdominal: Soft. Bowel sounds are normal.  Neurological: He is alert and oriented to person, place, and time.  Skin: Skin is warm and dry.  Psychiatric: He has a normal mood and affect.  Nursing note and vitals reviewed.  Assessment & Plan:   Problem List Items Addressed This Visit      Cardiovascular and Mediastinum   HTN (hypertension)   BP not at goal with current medications will add HTCZ.   Follow up with clinical pharmacist in 3 weeks.   Follow up with PCP in 8 weeks.    Relevant Medications   hydrochlorothiazide (HYDRODIURIL) 12.5 MG tablet     Endocrine   Type 2 diabetes mellitus (HCC) - Primary   Relevant Orders   Glucose (CBG) (Completed)      Meds ordered this encounter  Medications  . hydrochlorothiazide (HYDRODIURIL) 12.5 MG tablet    Sig: Take 1 tablet (12.5 mg total) by mouth daily.    Dispense:  30 tablet    Refill:  2    Order Specific Question:   Supervising Provider    Answer:   Tresa Garter [id number]    Follow-up: Return in about 3 weeks (around 06/20/2016) for BP check with Stacy .   Alfonse Spruce FNP

## 2016-05-30 NOTE — Patient Instructions (Signed)
How to Take Your Blood Pressure You can take your blood pressure at home with a machine. You may need to check your blood pressure at home:  To check if you have high blood pressure (hypertension).  To check your blood pressure over time.  To make sure your blood pressure medicine is working. Supplies needed: You will need a blood pressure machine, or monitor. You can buy one at a drugstore or online. When choosing one:  Choose one with an arm cuff.  Choose one that wraps around your upper arm. Only one finger should fit between your arm and the cuff.  Do not choose one that measures your blood pressure from your wrist or finger. Your doctor can suggest a monitor. How to prepare Avoid these things for 30 minutes before checking your blood pressure:  Drinking caffeine.  Drinking alcohol.  Eating.  Smoking.  Exercising. Five minutes before checking your blood pressure:  Pee.  Sit in a dining chair. Avoid sitting in a soft couch or armchair.  Be quiet. Do not talk. How to take your blood pressure Follow the instructions that came with your machine. If you have a digital blood pressure monitor, these may be the instructions: 1. Sit up straight. 2. Place your feet on the floor. Do not cross your ankles or legs. 3. Rest your left arm at the level of your heart. You may rest it on a table, desk, or chair. 4. Pull up your shirt sleeve. 5. Wrap the blood pressure cuff around the upper part of your left arm. The cuff should be 1 inch (2.5 cm) above your elbow. It is best to wrap the cuff around bare skin. 6. Fit the cuff snugly around your arm. You should be able to place only one finger between the cuff and your arm. 7. Put the cord inside the groove of your elbow. 8. Press the power button. 9. Sit quietly while the cuff fills with air and loses air. 10. Write down the numbers on the screen. 11. Wait 2-3 minutes and then repeat steps 1-10. What do the numbers mean? Two  numbers make up your blood pressure. The first number is called systolic pressure. The second is called diastolic pressure. An example of a blood pressure reading is "120 over 80" (or 120/80). If you are an adult and do not have a medical condition, use this guide to find out if your blood pressure is normal: Normal   First number: below 120.  Second number: below 80. Elevated   First number: 120-129.  Second number: below 80. Hypertension stage 1   First number: 130-139.  Second number: 80-89. Hypertension stage 2   First number: 140 or above.  Second number: 21 or above. Your blood pressure is above normal even if only the top or bottom number is above normal. Follow these instructions at home:  Check your blood pressure as often as your doctor tells you to.  Take your monitor to your next doctor's appointment. Your doctor will:  Make sure you are using it correctly.  Make sure it is working right.  Make sure you understand what your blood pressure numbers should be.  Tell your doctor if your medicines are causing side effects. Contact a doctor if:  Your blood pressure keeps being high. Get help right away if:  Your first blood pressure number is higher than 180.  Your second blood pressure number is higher than 120. This information is not intended to replace advice given to  you by your health care provider. Make sure you discuss any questions you have with your health care provider. Document Released: 12/01/2007 Document Revised: 11/16/2015 Document Reviewed: 05/27/2015 Elsevier Interactive Patient Education  2017 [place].   Hypotension As your heart beats, it forces blood through your body. This force is called blood pressure. If you have hypotension, you have low blood pressure. When your blood pressure is too low, you may not get enough blood to your brain. You may feel weak, feel light-headed, have a fast heartbeat, or even pass out (faint). Follow  these instructions at home: Eating and drinking   Drink enough fluids to keep your pee (urine) clear or pale yellow.  Eat a healthy diet, and follow instructions from your doctor about eating or drinking restrictions. A healthy diet includes:  Fresh fruits and vegetables.  Whole grains.  Low-fat (lean) meats.  Low-fat dairy products.  Eat extra salt only as told. Do not add extra salt to your diet unless your doctor tells you to.  Eat small meals often.  Avoid standing up quickly after you eat. Medicines   Take over-the-counter and prescription medicines only as told by your doctor.  Follow instructions from your doctor about changing how much you take (the dosage) of your medicines, if this applies.  Do not stop or change your medicine on your own. General instructions   Wear compression stockings as told by your doctor.  Get up slowly from lying down or sitting.  Avoid hot showers and a lot of heat as told by your doctor.  Return to your normal activities as told by your doctor. Ask what activities are safe for you.  Do not use any products that contain nicotine or tobacco, such as cigarettes and e-cigarettes. If you need help quitting, ask your doctor.  Keep all follow-up visits as told by your doctor. This is important. Contact a doctor if:  You throw up (vomit).  You have watery poop (diarrhea).  You have a fever for more than 2-3 days.  You feel more thirsty than normal.  You feel weak and tired. Get help right away if:  You have chest pain.  You have a fast or irregular heartbeat.  You lose feeling (get numbness) in any part of your body.  You cannot move your arms or your legs.  You have trouble talking.  You get sweaty or feel light-headed.  You faint.  You have trouble breathing.  You have trouble staying awake.  You feel confused. This information is not intended to replace advice given to you by your health care provider. Make sure  you discuss any questions you have with your health care provider. Document Released: 03/14/2009 Document Revised: 09/06/2015 Document Reviewed: 09/06/2015 Elsevier Interactive Patient Education  2017 [place].    Hydrochlorothiazide, HCTZ capsules or tablets What is this medicine? HYDROCHLOROTHIAZIDE (hye droe klor oh THYE a zide) is a diuretic. It increases the amount of urine passed, which causes the body to lose salt and water. This medicine is used to treat high blood pressure. It is also reduces the swelling and water retention caused by various medical conditions, such as heart, liver, or kidney disease. This medicine may be used for other purposes; ask your health care provider or pharmacist if you have questions. COMMON BRAND NAME(S): Esidrix, Ezide, HydroDIURIL, Microzide, Oretic, Zide What should I tell my health care provider before I take this medicine? They need to know if you have any of these conditions: -diabetes -gout -  immune system problems, like lupus -kidney disease or kidney stones -liver disease -pancreatitis -small amount of urine or difficulty passing urine -an unusual or allergic reaction to hydrochlorothiazide, sulfa drugs, other medicines, foods, dyes, or preservatives -pregnant or trying to get pregnant -breast-feeding How should I use this medicine? Take this medicine by mouth with a glass of water. Follow the directions on the prescription label. Take your medicine at regular intervals. Remember that you will need to pass urine frequently after taking this medicine. Do not take your doses at a time of day that will cause you problems. Do not stop taking your medicine unless your doctor tells you to. Talk to your pediatrician regarding the use of this medicine in children. Special care may be needed. Overdosage: If you think you have taken too much of this medicine contact a poison control center or emergency room at once. NOTE: This medicine is only for  you. Do not share this medicine with others. What if I miss a dose? If you miss a dose, take it as soon as you can. If it is almost time for your next dose, take only that dose. Do not take double or extra doses. What may interact with this medicine? -cholestyramine -colestipol -digoxin -dofetilide -lithium -medicines for blood pressure -medicines for diabetes -medicines that relax muscles for surgery -other diuretics -steroid medicines like prednisone or cortisone This list may not describe all possible interactions. Give your health care provider a list of all the medicines, herbs, non-prescription drugs, or dietary supplements you use. Also tell them if you smoke, drink alcohol, or use illegal drugs. Some items may interact with your medicine. What should I watch for while using this medicine? Visit your doctor or health care professional for regular checks on your progress. Check your blood pressure as directed. Ask your doctor or health care professional what your blood pressure should be and when you should contact him or her. You may need to be on a special diet while taking this medicine. Ask your doctor. Check with your doctor or health care professional if you get an attack of severe diarrhea, nausea and vomiting, or if you sweat a lot. The loss of too much body fluid can make it dangerous for you to take this medicine. You may get drowsy or dizzy. Do not drive, use machinery, or do anything that needs mental alertness until you know how this medicine affects you. Do not stand or sit up quickly, especially if you are an older patient. This reduces the risk of dizzy or fainting spells. Alcohol may interfere with the effect of this medicine. Avoid alcoholic drinks. This medicine may affect your blood sugar level. If you have diabetes, check with your doctor or health care professional before changing the dose of your diabetic medicine. This medicine can make you more sensitive to the  sun. Keep out of the sun. If you cannot avoid being in the sun, wear protective clothing and use sunscreen. Do not use sun lamps or tanning beds/booths. What side effects may I notice from receiving this medicine? Side effects that you should report to your doctor or health care professional as soon as possible: -allergic reactions such as skin rash or itching, hives, swelling of the lips, mouth, tongue, or throat -changes in vision -chest pain -eye pain -fast or irregular heartbeat -feeling faint or lightheaded, falls -gout attack -muscle pain or cramps -pain or difficulty when passing urine -pain, tingling, numbness in the hands or feet -redness, blistering, peeling or  loosening of the skin, including inside the mouth -unusually weak or tired Side effects that usually do not require medical attention (report to your doctor or health care professional if they continue or are bothersome): -change in sex drive or performance -dry mouth -headache -stomach upset This list may not describe all possible side effects. Call your doctor for medical advice about side effects. You may report side effects to FDA at 1-800-FDA-1088. Where should I keep my medicine? Keep out of the reach of children. Store at room temperature between 15 and 30 degrees C (59 and 86 degrees F). Do not freeze. Protect from light and moisture. Keep container closed tightly. Throw away any unused medicine after the expiration date. NOTE: This sheet is a summary. It may not cover all possible information. If you have questions about this medicine, talk to your doctor, pharmacist, or health care provider.  2018 Elsevier/Gold Standard (2009-08-12 12:57:37)

## 2016-05-30 NOTE — Progress Notes (Signed)
Patient is here for f/up  Patient has taking his medication  Patient has eaten   Patient denies pain

## 2016-06-20 ENCOUNTER — Ambulatory Visit: Payer: Medicare HMO | Attending: Family Medicine | Admitting: Pharmacist

## 2016-06-20 VITALS — BP 129/90 | HR 90

## 2016-06-20 DIAGNOSIS — I10 Essential (primary) hypertension: Secondary | ICD-10-CM | POA: Diagnosis not present

## 2016-06-20 DIAGNOSIS — Z79899 Other long term (current) drug therapy: Secondary | ICD-10-CM | POA: Diagnosis not present

## 2016-06-20 MED ORDER — HYDROCHLOROTHIAZIDE 25 MG PO TABS: 25.0000 mg | ORAL_TABLET | Freq: Every day | ORAL | 2 refills | Status: DC

## 2016-06-20 MED ORDER — HYDROCHLOROTHIAZIDE 25 MG PO TABS
25.0000 mg | ORAL_TABLET | Freq: Every day | ORAL | 2 refills | Status: DC
Start: 2016-06-20 — End: 2016-07-26

## 2016-06-20 NOTE — Patient Instructions (Addendum)
Thanks for coming to see Johnny Smith!  Increase hydrochlorothiazide to 25 mg daily   Come back in 2 weeks for blood pressure check

## 2016-06-20 NOTE — Progress Notes (Signed)
   S:    Patient arrives in good spirits.    Presents to the clinic for hypertension evaluation. Patient was referred on 05/30/16 by Karie Mainland. Jegede.  Patient was last seen by Primary Care Provider on 05/30/16.   Patient reports adherence with medications. Denies frequent urination.  Current BP Medications include:  Amlodipine-valsartan 10-320 mg daily, hydrochlorothiazide 12.5 mg daily.   Dietary habits include: tries to avoid salt  Patient has blood pressure cuff (wrist) at home and his readings have bene 140s-150s/100s. We checked it here and it appears to be 20/10 higher on the wrist cuff than ours.   O:   Last 3 Office BP readings: BP Readings from Last 3 Encounters:  06/20/16 129/90  05/30/16 (!) 148/92  05/17/16 (!) 154/98    BMET    Component Value Date/Time   NA 141 01/12/2016 1710   K 4.0 01/12/2016 1710   CL 107 01/12/2016 1710   CO2 20 01/12/2016 1710   GLUCOSE 125 (H) 01/12/2016 1710   BUN 12 01/12/2016 1710   CREATININE 1.03 01/12/2016 1710   CALCIUM 9.4 01/12/2016 1710   GFRNONAA >60 03/04/2015 1155   GFRNONAA 45 (L) 07/16/2012 1801   GFRAA >60 03/04/2015 1155   GFRAA 53 (L) 07/16/2012 1801    A/P: Hypertension longstanding currently UNcontrolled on current medications with diastolic still elevated. Based on home readings, diastolic has been consistently elevated and he has had some systolic elevations too. Will increase hydrochlorothiazide to 25 mg daily. Patient to follow up in 1-2 weeks for a blood pressure check.  Results reviewed and written information provided.   Total time in face-to-face counseling 15 minutes.   F/U Clinic Visit with [facility] in 5 weeks.  Patient seen with Maple Mirza, PharmD Candidate and Mechele Claude, PharmD Candidate

## 2016-06-25 ENCOUNTER — Other Ambulatory Visit: Payer: Self-pay | Admitting: Family Medicine

## 2016-06-25 DIAGNOSIS — E1165 Type 2 diabetes mellitus with hyperglycemia: Secondary | ICD-10-CM

## 2016-07-03 ENCOUNTER — Other Ambulatory Visit: Payer: Self-pay | Admitting: Family Medicine

## 2016-07-03 DIAGNOSIS — K219 Gastro-esophageal reflux disease without esophagitis: Secondary | ICD-10-CM

## 2016-07-11 ENCOUNTER — Encounter: Payer: Medicare HMO | Admitting: Pharmacist

## 2016-07-17 NOTE — Progress Notes (Signed)
   S:    Patient arrives in good spirits.    Presents to the clinic for hypertension evaluation. Patient was referred on 05/30/16 by Karie Mainland. Jegede.  Patient was last seen by Primary Care Provider on 05/30/16.   Patient reports adherence with medications. Denies frequent urination.  Current BP Medications include:  Amlodipine-valsartan 10-320 mg daily, hydrochlorothiazide 25 mg daily.   Dietary habits include: tries to avoid salt  Patient has blood pressure cuff (wrist) at home and his readings have been 120s-130s/80s. He has had pain the last few days and had a few SBP in the 140s.  O:   Last 3 Office BP readings: BP Readings from Last 3 Encounters:  07/18/16 118/82  06/20/16 129/90  05/30/16 (!) 148/92    BMET    Component Value Date/Time   NA 141 01/12/2016 1710   K 4.0 01/12/2016 1710   CL 107 01/12/2016 1710   CO2 20 01/12/2016 1710   GLUCOSE 125 (H) 01/12/2016 1710   BUN 12 01/12/2016 1710   CREATININE 1.03 01/12/2016 1710   CALCIUM 9.4 01/12/2016 1710   GFRNONAA >60 03/04/2015 1155   GFRNONAA 45 (L) 07/16/2012 1801   GFRAA >60 03/04/2015 1155   GFRAA 53 (L) 07/16/2012 1801    A/P: Hypertension longstanding currently controlled on current medications, and appears to be controlled at home with the exception of a few elevated blood pressure readings due to pain. Patient has been taking tramadol and his blood pressure has decreased.   Results reviewed and written information provided.   Total time in face-to-face counseling 15 minutes.   F/U Clinic Visit with [facility] in 1-2 weeks for diabetes

## 2016-07-18 ENCOUNTER — Ambulatory Visit: Payer: Medicare HMO | Attending: Family Medicine | Admitting: Pharmacist

## 2016-07-18 ENCOUNTER — Encounter: Payer: Self-pay | Admitting: Pharmacist

## 2016-07-18 VITALS — BP 118/82 | HR 76

## 2016-07-18 DIAGNOSIS — Z79899 Other long term (current) drug therapy: Secondary | ICD-10-CM | POA: Diagnosis not present

## 2016-07-18 DIAGNOSIS — I10 Essential (primary) hypertension: Secondary | ICD-10-CM

## 2016-07-18 MED ORDER — PIOGLITAZONE HCL 15 MG PO TABS: 15.0000 mg | ORAL_TABLET | Freq: Every day | ORAL | 0 refills | Status: DC

## 2016-07-18 NOTE — Patient Instructions (Signed)
Thanks for coming to see me  Make an appt with Mandesia in 1-2 weeks for diabetes follow up

## 2016-07-26 ENCOUNTER — Encounter: Payer: Self-pay | Admitting: Family Medicine

## 2016-07-26 ENCOUNTER — Ambulatory Visit: Payer: Medicare HMO | Attending: Family Medicine | Admitting: Family Medicine

## 2016-07-26 VITALS — BP 102/70 | HR 86 | Temp 98.1°F | Resp 18 | Ht 71.0 in | Wt 172.0 lb

## 2016-07-26 DIAGNOSIS — Z79899 Other long term (current) drug therapy: Secondary | ICD-10-CM | POA: Diagnosis not present

## 2016-07-26 DIAGNOSIS — Z7982 Long term (current) use of aspirin: Secondary | ICD-10-CM | POA: Diagnosis not present

## 2016-07-26 DIAGNOSIS — E1100 Type 2 diabetes mellitus with hyperosmolarity without nonketotic hyperglycemic-hyperosmolar coma (NKHHC): Secondary | ICD-10-CM | POA: Diagnosis not present

## 2016-07-26 DIAGNOSIS — E782 Mixed hyperlipidemia: Secondary | ICD-10-CM | POA: Diagnosis not present

## 2016-07-26 DIAGNOSIS — Z7984 Long term (current) use of oral hypoglycemic drugs: Secondary | ICD-10-CM | POA: Insufficient documentation

## 2016-07-26 DIAGNOSIS — E1165 Type 2 diabetes mellitus with hyperglycemia: Secondary | ICD-10-CM

## 2016-07-26 DIAGNOSIS — E785 Hyperlipidemia, unspecified: Secondary | ICD-10-CM | POA: Diagnosis not present

## 2016-07-26 DIAGNOSIS — I10 Essential (primary) hypertension: Secondary | ICD-10-CM

## 2016-07-26 DIAGNOSIS — E119 Type 2 diabetes mellitus without complications: Secondary | ICD-10-CM | POA: Diagnosis present

## 2016-07-26 DIAGNOSIS — E039 Hypothyroidism, unspecified: Secondary | ICD-10-CM | POA: Diagnosis not present

## 2016-07-26 DIAGNOSIS — K219 Gastro-esophageal reflux disease without esophagitis: Secondary | ICD-10-CM

## 2016-07-26 LAB — POCT GLYCOSYLATED HEMOGLOBIN (HGB A1C): HEMOGLOBIN A1C: 8.9

## 2016-07-26 LAB — GLUCOSE, POCT (MANUAL RESULT ENTRY): POC Glucose: 155 mg/dl — AB (ref 70–99)

## 2016-07-26 MED ORDER — AMLODIPINE BESYLATE-VALSARTAN 10-320 MG PO TABS: 1.0000 | ORAL_TABLET | Freq: Every day | ORAL | 3 refills | Status: AC

## 2016-07-26 MED ORDER — PIOGLITAZONE HCL 30 MG PO TABS: 30.0000 mg | ORAL_TABLET | Freq: Every day | ORAL | 2 refills | Status: DC

## 2016-07-26 MED ORDER — HYDROCHLOROTHIAZIDE 25 MG PO TABS: 25.0000 mg | ORAL_TABLET | Freq: Every day | ORAL | 3 refills | Status: AC

## 2016-07-26 MED ORDER — SITAGLIPTIN PHOSPHATE 100 MG PO TABS: 100.0000 mg | ORAL_TABLET | Freq: Every day | ORAL | 3 refills | Status: AC

## 2016-07-26 MED ORDER — GLIPIZIDE 10 MG PO TABS: ORAL_TABLET | ORAL | 3 refills | Status: AC

## 2016-07-26 MED ORDER — OMEPRAZOLE 40 MG PO CPDR: 40.0000 mg | DELAYED_RELEASE_CAPSULE | Freq: Every day | ORAL | 3 refills | Status: AC | PRN

## 2016-07-26 MED ORDER — ASPIRIN EC 81 MG PO TBEC: 81.0000 mg | DELAYED_RELEASE_TABLET | Freq: Every day | ORAL | 3 refills | Status: AC

## 2016-07-26 MED ORDER — METFORMIN HCL 1000 MG PO TABS: 1000.0000 mg | ORAL_TABLET | Freq: Two times a day (BID) | ORAL | 3 refills | Status: AC

## 2016-07-26 MED ORDER — METOPROLOL SUCCINATE ER 100 MG PO TB24: 100.0000 mg | ORAL_TABLET | Freq: Every day | ORAL | 3 refills | Status: AC

## 2016-07-26 NOTE — Patient Instructions (Addendum)
Start check CBG twice daily. Bring log and glucometer to next office visit.

## 2016-07-26 NOTE — Progress Notes (Signed)
Subjective:  Patient ID: Johnny Smith, male    DOB: Apr 07, 1950  Age: 66 y.o. MRN: [id number]  CC: Diabetes   HPI Assad Harbeson presents for follow up of diabetes. Symptoms: none. Patient denies foot ulcerations, nausea, paresthesia of the feet, visual disturbances and vomitting.  Evaluation to date has been included: fasting blood sugar, fasting lipid panel, hemoglobin A1C and microalbuminuria.  Home sugars: He reports taking his blood glucose QD. BGs range between 155-190's in the am. Treatment to date:  Sulfonylurea, metformin, DDP inhibitors. Recent history of clinical pharmacist visit where actos was added for better glycemic control. He reports self adjusting his metformin dosage last week for 1 week and increased from BID to TID.  History of hypertension. He is not exercising and is adherent to low salt diet.  Blood pressure He reports not checking BP at home  .Cardiac symptoms none. Patient denies chest pain, claudication, dyspnea, lower extremity edema, near-syncope, palpitations and syncope.  Cardiovascular risk factors: advanced age (older than 86 for men, 46 for women), diabetes mellitus, dyslipidemia, hypertension, male gender and sedentary lifestyle. Use of agents associated with hypertension: none. History of target organ damage: none. He reports adherence with his antihypertensive medications.   Outpatient Medications Prior to Visit  Medication Sig Dispense Refill  . atorvastatin (LIPITOR) 40 MG tablet Take 1 tablet (40 mg total) by mouth daily. 90 tablet 0  . DULoxetine (CYMBALTA) 30 MG capsule Take 1 capsule (30 mg total) by mouth daily. 30 capsule 1  . ferrous sulfate 325 (65 FE) MG EC tablet Take 325 mg by mouth daily.    Marland Kitchen ibuprofen (ADVIL,MOTRIN) 600 MG tablet Take 1 tablet (600 mg total) by mouth 3 (three) times daily as needed. 90 tablet 1  . levothyroxine (SYNTHROID, LEVOTHROID) 125 MCG tablet Take 1 tablet (125 mcg total) by mouth daily before breakfast. 90 tablet 0  .  methocarbamol (ROBAXIN) 500 MG tablet Take 1 tablet (500 mg total) by mouth 2 (two) times daily as needed for muscle spasms. 60 tablet 1  . traMADol (ULTRAM) 50 MG tablet Take 1 tablet (50 mg total) by mouth 2 (two) times daily as needed. 60 tablet 1  . amLODipine-valsartan (EXFORGE) 10-320 MG tablet Take 1 tablet by mouth daily. 90 tablet 0  . aspirin EC 81 MG tablet Take 1 tablet (81 mg total) by mouth daily. 90 tablet 0  . glipiZIDE (GLUCOTROL) 10 MG tablet TAKE 1 TABLET TWICE A DAY  BEFORE A MEAL 180 tablet 0  . hydrochlorothiazide (HYDRODIURIL) 25 MG tablet Take 1 tablet (25 mg total) by mouth daily. 30 tablet 2  . metFORMIN (GLUCOPHAGE) 1000 MG tablet Take 1 tablet (1,000 mg total) by mouth 2 (two) times daily with a meal. 180 tablet 0  . metoprolol succinate (TOPROL-XL) 100 MG 24 hr tablet Take 1 tablet (100 mg total) by mouth daily. Take with or immediately following a meal. 90 tablet 0  . omeprazole (PRILOSEC) 40 MG capsule TAKE 1 CAPSULE DAILY 90 capsule 0  . pioglitazone (ACTOS) 15 MG tablet Take 1 tablet (15 mg total) by mouth daily. 30 tablet 0  . sitaGLIPtin (JANUVIA) 100 MG tablet Take 1 tablet (100 mg total) by mouth daily. 90 tablet 0   No facility-administered medications prior to visit.     ROS Review of Systems  Constitutional: Negative.   Eyes: Negative.   Respiratory: Negative.   Cardiovascular: Negative.   Gastrointestinal: Negative.   Musculoskeletal: Negative.   Skin: Negative.   Neurological: Negative.  Objective:  BP 102/70 (BP Location: Left Arm, Patient Position: Sitting, Cuff Size: Normal)   Pulse 86   Temp 98.1 F (36.7 C) (Oral)   Resp 18   Ht 5\' 11"  (1.803 m)   Wt 172 lb (78 kg)   SpO2 97%   BMI 23.99 kg/m   BP/Weight 07/26/2016 07/18/2016 9/32/3557  Systolic BP 322 025 427  Diastolic BP 70 82 90  Wt. (Lbs) 172 - -  BMI 23.99 - -   Physical Exam  Constitutional: He appears well-developed and well-nourished.  HENT:  Head: Normocephalic  and atraumatic.  Right Ear: External ear normal.  Left Ear: External ear normal.  Nose: Nose normal.  Mouth/Throat: Oropharynx is clear and moist.  Eyes: Pupils are equal, round, and reactive to light. Conjunctivae are normal.  Neck: No thyromegaly present.  Cardiovascular: Normal rate, regular rhythm, normal heart sounds and intact distal pulses.   Pulmonary/Chest: Effort normal and breath sounds normal.  Abdominal: Soft. Bowel sounds are normal.  Skin: Skin is warm and dry.  Nursing note and vitals reviewed.  Assessment & Plan:   Problem List Items Addressed This Visit      Cardiovascular and Mediastinum   HTN (hypertension)   Relevant Medications   amLODipine-valsartan (EXFORGE) 10-320 MG tablet   metoprolol succinate (TOPROL-XL) 100 MG 24 hr tablet   hydrochlorothiazide (HYDRODIURIL) 25 MG tablet   aspirin EC 81 MG tablet     Digestive   GERD (gastroesophageal reflux disease)   Relevant Medications   omeprazole (PRILOSEC) 40 MG capsule     Endocrine   Hypothyroidism   Other Relevant Orders   TSH (Completed)   Type 2 diabetes mellitus ([place]) - Primary   Pioglitazone dosage increased for better glucose control.    Follow up with PCP in 6 weeks.   Relevant Medications   pioglitazone (ACTOS) 30 MG tablet   amLODipine-valsartan (EXFORGE) 10-320 MG tablet   metFORMIN (GLUCOPHAGE) 1000 MG tablet   aspirin EC 81 MG tablet   glipiZIDE (GLUCOTROL) 10 MG tablet   sitaGLIPtin (JANUVIA) 100 MG tablet   Other Relevant Orders   Glucose (CBG) (Completed)   HgB A1c (Completed)     Other   Hyperlipidemia   Relevant Medications   aspirin EC 81 MG tablet   Other Relevant Orders   Lipid Panel (Completed)      Meds ordered this encounter  Medications  . pioglitazone (ACTOS) 30 MG tablet    Sig: Take 1 tablet (30 mg total) by mouth daily.    Dispense:  30 tablet    Refill:  2    Order Specific Question:   Supervising Provider    Answer:   Tresa Garter [id number]  .  amLODipine-valsartan (EXFORGE) 10-320 MG tablet    Sig: Take 1 tablet by mouth daily.    Dispense:  90 tablet    Refill:  3    Order Specific Question:   Supervising Provider    Answer:   Tresa Garter [id number]  . metoprolol succinate (TOPROL-XL) 100 MG 24 hr tablet    Sig: Take 1 tablet (100 mg total) by mouth daily. Take with or immediately following a meal.    Dispense:  90 tablet    Refill:  3    Order Specific Question:   Supervising Provider    Answer:   Tresa Garter [id number]  . hydrochlorothiazide (HYDRODIURIL) 25 MG tablet    Sig: Take 1 tablet (25 mg total) by mouth daily.  Dispense:  90 tablet    Refill:  3    Order Specific Question:   Supervising Provider    Answer:   Tresa Garter [id number]  . metFORMIN (GLUCOPHAGE) 1000 MG tablet    Sig: Take 1 tablet (1,000 mg total) by mouth 2 (two) times daily with a meal.    Dispense:  180 tablet    Refill:  3    Order Specific Question:   Supervising Provider    Answer:   Tresa Garter [id number]  . omeprazole (PRILOSEC) 40 MG capsule    Sig: Take 1 capsule (40 mg total) by mouth daily as needed.    Dispense:  90 capsule    Refill:  3    Order Specific Question:   Supervising Provider    Answer:   Tresa Garter [id number]  . aspirin EC 81 MG tablet    Sig: Take 1 tablet (81 mg total) by mouth daily.    Dispense:  90 tablet    Refill:  3    Order Specific Question:   Supervising Provider    Answer:   Tresa Garter [id number]  . glipiZIDE (GLUCOTROL) 10 MG tablet    Sig: TAKE 1 TABLET TWICE A DAY  BEFORE A MEAL    Dispense:  180 tablet    Refill:  3    Order Specific Question:   Supervising Provider    Answer:   Tresa Garter [id number]  . sitaGLIPtin (JANUVIA) 100 MG tablet    Sig: Take 1 tablet (100 mg total) by mouth daily.    Dispense:  90 tablet    Refill:  3    Order Specific Question:   Supervising Provider    Answer:   Tresa Garter [id number]     Follow-up: Return in about 6 weeks (around 09/06/2016) for Diabetes.   Alfonse Spruce FNP

## 2016-07-26 NOTE — Progress Notes (Signed)
Patient is here for f/up DM

## 2016-07-27 LAB — LIPID PANEL
CHOL/HDL RATIO: 4.6 ratio (ref 0.0–5.0)
Cholesterol, Total: 125 mg/dL (ref 100–199)
HDL: 27 mg/dL — AB (ref >39)
LDL CALC: 67 mg/dL (ref 0–99)
TRIGLYCERIDES: 153 mg/dL — AB (ref 0–149)
VLDL CHOLESTEROL CAL: 31 mg/dL (ref 5–40)

## 2016-07-27 LAB — TSH: TSH: 0.625 u[IU]/mL (ref 0.450–4.500)

## 2016-08-03 ENCOUNTER — Other Ambulatory Visit: Payer: Self-pay | Admitting: Family Medicine

## 2016-08-03 DIAGNOSIS — E039 Hypothyroidism, unspecified: Secondary | ICD-10-CM

## 2016-08-03 DIAGNOSIS — E782 Mixed hyperlipidemia: Secondary | ICD-10-CM

## 2016-08-03 MED ORDER — OMEGA-3-ACID ETHYL ESTERS 1 G PO CAPS: 1.0000 g | ORAL_CAPSULE | Freq: Two times a day (BID) | ORAL | 1 refills | Status: AC

## 2016-08-03 MED ORDER — ATORVASTATIN CALCIUM 40 MG PO TABS: 40.0000 mg | ORAL_TABLET | Freq: Every day | ORAL | 1 refills | Status: AC

## 2016-08-03 MED ORDER — LEVOTHYROXINE SODIUM 125 MCG PO TABS: 125.0000 ug | ORAL_TABLET | Freq: Every day | ORAL | 1 refills | Status: AC

## 2016-08-15 ENCOUNTER — Other Ambulatory Visit: Payer: Self-pay | Admitting: Family Medicine

## 2016-08-16 ENCOUNTER — Telehealth: Payer: Self-pay

## 2016-08-16 NOTE — Telephone Encounter (Signed)
CMA call regarding lab results   Patient verify DOB  Patient was aware and understood  

## 2016-08-16 NOTE — Progress Notes (Signed)
Please inform patient of results

## 2016-08-16 NOTE — Telephone Encounter (Signed)
-----   Message from Trecia Rogers, [place] sent at 08/16/2016  3:17 PM EDT ----- Please inform patient of results.

## 2016-09-12 ENCOUNTER — Ambulatory Visit: Payer: Medicare HMO | Admitting: Family Medicine

## 2016-09-18 DIAGNOSIS — E782 Mixed hyperlipidemia: Secondary | ICD-10-CM | POA: Diagnosis not present

## 2016-09-18 DIAGNOSIS — I10 Essential (primary) hypertension: Secondary | ICD-10-CM | POA: Diagnosis not present

## 2016-09-18 DIAGNOSIS — Z859 Personal history of malignant neoplasm, unspecified: Secondary | ICD-10-CM | POA: Diagnosis not present

## 2016-09-18 DIAGNOSIS — E1100 Type 2 diabetes mellitus with hyperosmolarity without nonketotic hyperglycemic-hyperosmolar coma (NKHHC): Secondary | ICD-10-CM | POA: Diagnosis not present

## 2016-10-03 DIAGNOSIS — I10 Essential (primary) hypertension: Secondary | ICD-10-CM | POA: Diagnosis not present

## 2016-10-03 DIAGNOSIS — E1100 Type 2 diabetes mellitus with hyperosmolarity without nonketotic hyperglycemic-hyperosmolar coma (NKHHC): Secondary | ICD-10-CM | POA: Diagnosis not present

## 2016-10-03 DIAGNOSIS — E782 Mixed hyperlipidemia: Secondary | ICD-10-CM | POA: Diagnosis not present

## 2016-10-17 DIAGNOSIS — Z0000 Encounter for general adult medical examination without abnormal findings: Secondary | ICD-10-CM | POA: Diagnosis not present

## 2016-10-31 DIAGNOSIS — Z23 Encounter for immunization: Secondary | ICD-10-CM | POA: Diagnosis not present

## 2016-10-31 DIAGNOSIS — I10 Essential (primary) hypertension: Secondary | ICD-10-CM | POA: Diagnosis not present

## 2016-10-31 DIAGNOSIS — E782 Mixed hyperlipidemia: Secondary | ICD-10-CM | POA: Diagnosis not present

## 2016-10-31 DIAGNOSIS — E1100 Type 2 diabetes mellitus with hyperosmolarity without nonketotic hyperglycemic-hyperosmolar coma (NKHHC): Secondary | ICD-10-CM | POA: Diagnosis not present

## 2016-11-15 ENCOUNTER — Other Ambulatory Visit: Payer: Self-pay | Admitting: Family Medicine

## 2016-11-15 DIAGNOSIS — E1165 Type 2 diabetes mellitus with hyperglycemia: Secondary | ICD-10-CM

## 2016-11-26 ENCOUNTER — Other Ambulatory Visit: Payer: Self-pay | Admitting: Family Medicine

## 2016-11-26 DIAGNOSIS — E1165 Type 2 diabetes mellitus with hyperglycemia: Secondary | ICD-10-CM

## 2016-11-27 DIAGNOSIS — H5203 Hypermetropia, bilateral: Secondary | ICD-10-CM | POA: Diagnosis not present

## 2016-11-27 DIAGNOSIS — H524 Presbyopia: Secondary | ICD-10-CM | POA: Diagnosis not present

## 2016-12-04 DIAGNOSIS — I10 Essential (primary) hypertension: Secondary | ICD-10-CM | POA: Diagnosis not present

## 2016-12-04 DIAGNOSIS — E119 Type 2 diabetes mellitus without complications: Secondary | ICD-10-CM | POA: Diagnosis not present

## 2016-12-04 DIAGNOSIS — E089 Diabetes mellitus due to underlying condition without complications: Secondary | ICD-10-CM | POA: Diagnosis not present

## 2017-02-13 DIAGNOSIS — R5383 Other fatigue: Secondary | ICD-10-CM | POA: Diagnosis not present

## 2017-02-13 DIAGNOSIS — I10 Essential (primary) hypertension: Secondary | ICD-10-CM | POA: Diagnosis not present

## 2017-02-13 DIAGNOSIS — Z8546 Personal history of malignant neoplasm of prostate: Secondary | ICD-10-CM | POA: Diagnosis not present

## 2017-02-13 DIAGNOSIS — E782 Mixed hyperlipidemia: Secondary | ICD-10-CM | POA: Diagnosis not present

## 2017-02-13 DIAGNOSIS — E118 Type 2 diabetes mellitus with unspecified complications: Secondary | ICD-10-CM | POA: Diagnosis not present

## 2017-02-13 DIAGNOSIS — E089 Diabetes mellitus due to underlying condition without complications: Secondary | ICD-10-CM | POA: Diagnosis not present

## 2017-05-14 DIAGNOSIS — D6489 Other specified anemias: Secondary | ICD-10-CM | POA: Diagnosis not present

## 2017-05-14 DIAGNOSIS — E785 Hyperlipidemia, unspecified: Secondary | ICD-10-CM | POA: Diagnosis not present

## 2017-05-14 DIAGNOSIS — E782 Mixed hyperlipidemia: Secondary | ICD-10-CM | POA: Diagnosis not present

## 2017-05-14 DIAGNOSIS — E1100 Type 2 diabetes mellitus with hyperosmolarity without nonketotic hyperglycemic-hyperosmolar coma (NKHHC): Secondary | ICD-10-CM | POA: Diagnosis not present

## 2017-05-14 DIAGNOSIS — I10 Essential (primary) hypertension: Secondary | ICD-10-CM | POA: Diagnosis not present

## 2017-08-14 DIAGNOSIS — E1100 Type 2 diabetes mellitus with hyperosmolarity without nonketotic hyperglycemic-hyperosmolar coma (NKHHC): Secondary | ICD-10-CM | POA: Diagnosis not present

## 2017-08-14 DIAGNOSIS — Z6826 Body mass index (BMI) 26.0-26.9, adult: Secondary | ICD-10-CM | POA: Diagnosis not present

## 2017-08-14 DIAGNOSIS — I10 Essential (primary) hypertension: Secondary | ICD-10-CM | POA: Diagnosis not present

## 2017-08-14 DIAGNOSIS — C61 Malignant neoplasm of prostate: Secondary | ICD-10-CM | POA: Diagnosis not present

## 2017-08-14 DIAGNOSIS — E785 Hyperlipidemia, unspecified: Secondary | ICD-10-CM | POA: Diagnosis not present

## 2017-08-17 DIAGNOSIS — E119 Type 2 diabetes mellitus without complications: Secondary | ICD-10-CM | POA: Diagnosis not present

## 2017-08-17 DIAGNOSIS — H524 Presbyopia: Secondary | ICD-10-CM | POA: Diagnosis not present

## 2017-10-02 DIAGNOSIS — E1165 Type 2 diabetes mellitus with hyperglycemia: Secondary | ICD-10-CM | POA: Diagnosis not present

## 2017-10-02 DIAGNOSIS — I10 Essential (primary) hypertension: Secondary | ICD-10-CM | POA: Diagnosis not present

## 2017-10-02 DIAGNOSIS — E039 Hypothyroidism, unspecified: Secondary | ICD-10-CM | POA: Diagnosis not present

## 2017-10-02 DIAGNOSIS — K219 Gastro-esophageal reflux disease without esophagitis: Secondary | ICD-10-CM | POA: Diagnosis not present

## 2017-10-02 DIAGNOSIS — Z7984 Long term (current) use of oral hypoglycemic drugs: Secondary | ICD-10-CM | POA: Diagnosis not present

## 2017-10-02 DIAGNOSIS — K08109 Complete loss of teeth, unspecified cause, unspecified class: Secondary | ICD-10-CM | POA: Diagnosis not present

## 2017-10-02 DIAGNOSIS — R110 Nausea: Secondary | ICD-10-CM | POA: Diagnosis not present

## 2017-10-02 DIAGNOSIS — Z7982 Long term (current) use of aspirin: Secondary | ICD-10-CM | POA: Diagnosis not present

## 2017-10-02 DIAGNOSIS — G8929 Other chronic pain: Secondary | ICD-10-CM | POA: Diagnosis not present

## 2017-10-02 DIAGNOSIS — E785 Hyperlipidemia, unspecified: Secondary | ICD-10-CM | POA: Diagnosis not present

## 2017-11-14 DIAGNOSIS — E782 Mixed hyperlipidemia: Secondary | ICD-10-CM | POA: Diagnosis not present

## 2017-11-14 DIAGNOSIS — R69 Illness, unspecified: Secondary | ICD-10-CM | POA: Diagnosis not present

## 2017-11-14 DIAGNOSIS — C61 Malignant neoplasm of prostate: Secondary | ICD-10-CM | POA: Diagnosis not present

## 2017-11-14 DIAGNOSIS — Z0000 Encounter for general adult medical examination without abnormal findings: Secondary | ICD-10-CM | POA: Diagnosis not present

## 2017-11-14 DIAGNOSIS — Z6826 Body mass index (BMI) 26.0-26.9, adult: Secondary | ICD-10-CM | POA: Diagnosis not present

## 2017-11-14 DIAGNOSIS — I10 Essential (primary) hypertension: Secondary | ICD-10-CM | POA: Diagnosis not present

## 2017-11-14 DIAGNOSIS — E1100 Type 2 diabetes mellitus with hyperosmolarity without nonketotic hyperglycemic-hyperosmolar coma (NKHHC): Secondary | ICD-10-CM | POA: Diagnosis not present

## 2017-12-05 DIAGNOSIS — Z6825 Body mass index (BMI) 25.0-25.9, adult: Secondary | ICD-10-CM | POA: Diagnosis not present

## 2017-12-05 DIAGNOSIS — R69 Illness, unspecified: Secondary | ICD-10-CM | POA: Diagnosis not present

## 2017-12-06 ENCOUNTER — Other Ambulatory Visit: Payer: Self-pay | Admitting: Family Medicine

## 2017-12-06 DIAGNOSIS — E782 Mixed hyperlipidemia: Secondary | ICD-10-CM | POA: Diagnosis not present

## 2017-12-12 DIAGNOSIS — R69 Illness, unspecified: Secondary | ICD-10-CM | POA: Diagnosis not present

## 2018-01-31 DIAGNOSIS — J309 Allergic rhinitis, unspecified: Secondary | ICD-10-CM | POA: Diagnosis not present

## 2018-01-31 DIAGNOSIS — I10 Essential (primary) hypertension: Secondary | ICD-10-CM | POA: Diagnosis not present

## 2018-01-31 DIAGNOSIS — E782 Mixed hyperlipidemia: Secondary | ICD-10-CM | POA: Diagnosis not present

## 2018-01-31 DIAGNOSIS — E1100 Type 2 diabetes mellitus with hyperosmolarity without nonketotic hyperglycemic-hyperosmolar coma (NKHHC): Secondary | ICD-10-CM | POA: Diagnosis not present

## 2018-01-31 DIAGNOSIS — J300 Vasomotor rhinitis: Secondary | ICD-10-CM | POA: Diagnosis not present

## 2018-02-07 DIAGNOSIS — C61 Malignant neoplasm of prostate: Secondary | ICD-10-CM | POA: Diagnosis not present

## 2018-02-07 DIAGNOSIS — N5201 Erectile dysfunction due to arterial insufficiency: Secondary | ICD-10-CM | POA: Diagnosis not present

## 2018-03-14 ENCOUNTER — Ambulatory Visit
Admission: RE | Admit: 2018-03-14 | Discharge: 2018-03-14 | Disposition: A | Discharge status: Home / Self Care | Payer: Medicare HMO | Source: Ambulatory Visit | Attending: Family Medicine | Admitting: Family Medicine

## 2018-03-14 ENCOUNTER — Other Ambulatory Visit: Payer: Self-pay | Admitting: Family Medicine

## 2018-03-14 DIAGNOSIS — M7989 Other specified soft tissue disorders: Secondary | ICD-10-CM | POA: Diagnosis not present

## 2018-03-14 DIAGNOSIS — R52 Pain, unspecified: Secondary | ICD-10-CM

## 2018-03-14 DIAGNOSIS — M79672 Pain in left foot: Secondary | ICD-10-CM | POA: Diagnosis not present

## 2018-03-14 DIAGNOSIS — M79673 Pain in unspecified foot: Secondary | ICD-10-CM | POA: Diagnosis not present

## 2018-04-25 DIAGNOSIS — E785 Hyperlipidemia, unspecified: Secondary | ICD-10-CM | POA: Diagnosis not present

## 2018-04-25 DIAGNOSIS — E1169 Type 2 diabetes mellitus with other specified complication: Secondary | ICD-10-CM | POA: Diagnosis not present

## 2018-04-25 DIAGNOSIS — I10 Essential (primary) hypertension: Secondary | ICD-10-CM | POA: Diagnosis not present

## 2018-04-25 DIAGNOSIS — J301 Allergic rhinitis due to pollen: Secondary | ICD-10-CM | POA: Diagnosis not present

## 2018-07-25 DIAGNOSIS — I10 Essential (primary) hypertension: Secondary | ICD-10-CM | POA: Diagnosis not present

## 2018-07-31 DIAGNOSIS — E1169 Type 2 diabetes mellitus with other specified complication: Secondary | ICD-10-CM | POA: Diagnosis not present

## 2018-07-31 DIAGNOSIS — I10 Essential (primary) hypertension: Secondary | ICD-10-CM | POA: Diagnosis not present

## 2019-01-25 ENCOUNTER — Ambulatory Visit: Payer: Medicare HMO | Attending: Internal Medicine

## 2019-01-25 DIAGNOSIS — Z23 Encounter for immunization: Secondary | ICD-10-CM

## 2019-01-26 NOTE — Progress Notes (Signed)
   Covid-19 Vaccination Clinic  Name:  Johnny Smith    MRN: [id number] DOB: [birth year]  01/25/2019  Johnny Smith was observed post Covid-19 immunization for 15 minutes without incidence. He was provided with Vaccine Information Sheet and instruction to access the V-Safe system.   Johnny Smith was instructed to call 911 with any severe reactions post vaccine: Marland Kitchen Difficulty breathing  . Swelling of your face and throat  . A fast heartbeat  . A bad rash all over your body  . Dizziness and weakness    Immunizations Administered    Name Date Dose VIS Date Route   Moderna COVID-19 Vaccine 01/25/2019  4:51 PM 0.5 mL 12/02/2018 Intramuscular   Manufacturer: Levan Hurst   LotJE:[id number]   [id number]      Documented on behalf of:  Tia Masker, MD

## 2019-02-22 ENCOUNTER — Ambulatory Visit: Payer: Medicare HMO | Attending: Internal Medicine

## 2019-02-22 DIAGNOSIS — Z23 Encounter for immunization: Secondary | ICD-10-CM

## 2019-02-22 NOTE — Progress Notes (Signed)
   Covid-19 Vaccination Clinic  Name:  Johnny Smith    MRN: [id number] DOB: [birth year]  02/22/2019  [name] was observed post Covid-19 immunization for 15 minutes without incidence. He was provided with Vaccine Information Sheet and instruction to access the V-Safe system.   [name] was instructed to call 911 with any severe reactions post vaccine: Marland Kitchen Difficulty breathing  . Swelling of your face and throat  . A fast heartbeat  . A bad rash all over your body  . Dizziness and weakness    Immunizations Administered    Name Date Dose VIS Date Route   Moderna COVID-19 Vaccine 02/22/2019 12:04 PM 0.5 mL 12/02/2018 Intramuscular   Manufacturer: Moderna   Lot: [id number]   [id number]

## 2020-08-18 ENCOUNTER — Other Ambulatory Visit: Payer: Self-pay | Admitting: Family Medicine

## 2020-08-18 DIAGNOSIS — I714 Abdominal aortic aneurysm, without rupture, unspecified: Secondary | ICD-10-CM

## 2020-08-25 ENCOUNTER — Other Ambulatory Visit: Payer: Self-pay

## 2020-08-25 ENCOUNTER — Ambulatory Visit
Admission: RE | Admit: 2020-08-25 | Discharge: 2020-08-25 | Disposition: A | Discharge status: Home / Self Care | Payer: Medicare Other | Source: Ambulatory Visit | Attending: Family Medicine | Admitting: Family Medicine

## 2020-08-25 DIAGNOSIS — I714 Abdominal aortic aneurysm, without rupture, unspecified: Secondary | ICD-10-CM

## 2022-06-08 DIAGNOSIS — N476 Balanoposthitis: Secondary | ICD-10-CM | POA: Diagnosis not present

## 2022-07-26 DIAGNOSIS — E1169 Type 2 diabetes mellitus with other specified complication: Secondary | ICD-10-CM | POA: Diagnosis not present

## 2022-07-26 DIAGNOSIS — E039 Hypothyroidism, unspecified: Secondary | ICD-10-CM | POA: Diagnosis not present

## 2022-07-26 DIAGNOSIS — N183 Chronic kidney disease, stage 3 unspecified: Secondary | ICD-10-CM | POA: Diagnosis not present

## 2022-09-28 DIAGNOSIS — E119 Type 2 diabetes mellitus without complications: Secondary | ICD-10-CM | POA: Diagnosis not present

## 2022-10-02 DIAGNOSIS — E559 Vitamin D deficiency, unspecified: Secondary | ICD-10-CM | POA: Diagnosis not present

## 2022-10-02 DIAGNOSIS — E039 Hypothyroidism, unspecified: Secondary | ICD-10-CM | POA: Diagnosis not present

## 2022-10-02 DIAGNOSIS — E1169 Type 2 diabetes mellitus with other specified complication: Secondary | ICD-10-CM | POA: Diagnosis not present

## 2022-10-02 DIAGNOSIS — N183 Chronic kidney disease, stage 3 unspecified: Secondary | ICD-10-CM | POA: Diagnosis not present

## 2022-10-02 DIAGNOSIS — I10 Essential (primary) hypertension: Secondary | ICD-10-CM | POA: Diagnosis not present

## 2022-10-04 DIAGNOSIS — N183 Chronic kidney disease, stage 3 unspecified: Secondary | ICD-10-CM | POA: Diagnosis not present

## 2022-10-04 DIAGNOSIS — E039 Hypothyroidism, unspecified: Secondary | ICD-10-CM | POA: Diagnosis not present

## 2022-10-04 DIAGNOSIS — E1169 Type 2 diabetes mellitus with other specified complication: Secondary | ICD-10-CM | POA: Diagnosis not present

## 2022-10-04 DIAGNOSIS — Z23 Encounter for immunization: Secondary | ICD-10-CM | POA: Diagnosis not present

## 2022-10-04 DIAGNOSIS — I129 Hypertensive chronic kidney disease with stage 1 through stage 4 chronic kidney disease, or unspecified chronic kidney disease: Secondary | ICD-10-CM | POA: Diagnosis not present

## 2022-10-04 DIAGNOSIS — M6284 Sarcopenia: Secondary | ICD-10-CM | POA: Diagnosis not present

## 2022-10-13 IMAGING — US US AORTA
1 series · 14 of 24 positions shown · non-contrast
Comparison: None.

CLINICAL DATA: Abdominal aortic aneurysm without rupture (HCC). r/o
aortic aneurysm. 70-year-old male with hypertension, diabetes, and
hyperlipidemia.

EXAM:
ULTRASOUND OF ABDOMINAL AORTA
TECHNIQUE: Ultrasound examination of the abdominal aorta and proximal common
iliac arteries was performed to evaluate for aneurysm. Additional
color and Doppler images of the distal aorta were obtained to
document patency.

[Series 1: us aorta · 0.31mm/px · 14 of 24 slices shown]
[im 1/24]
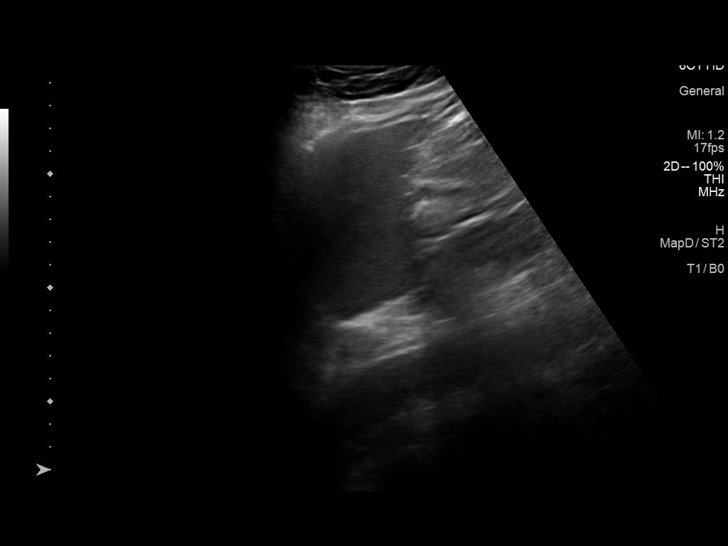
[im 3/24]
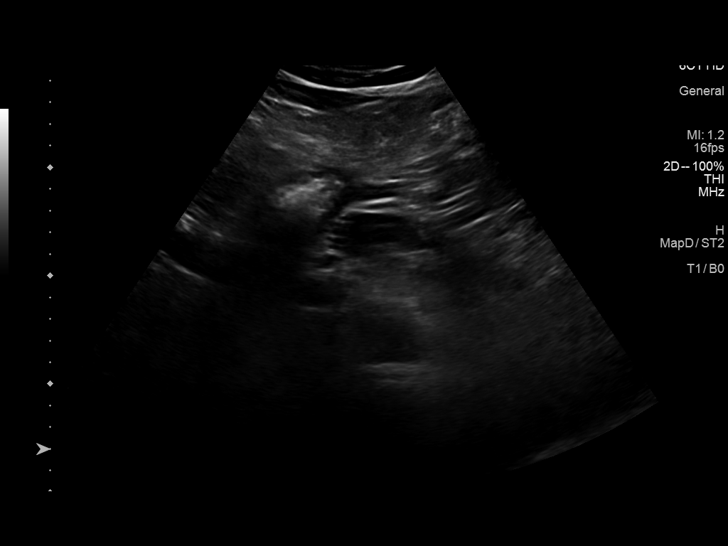
[im 5/24]
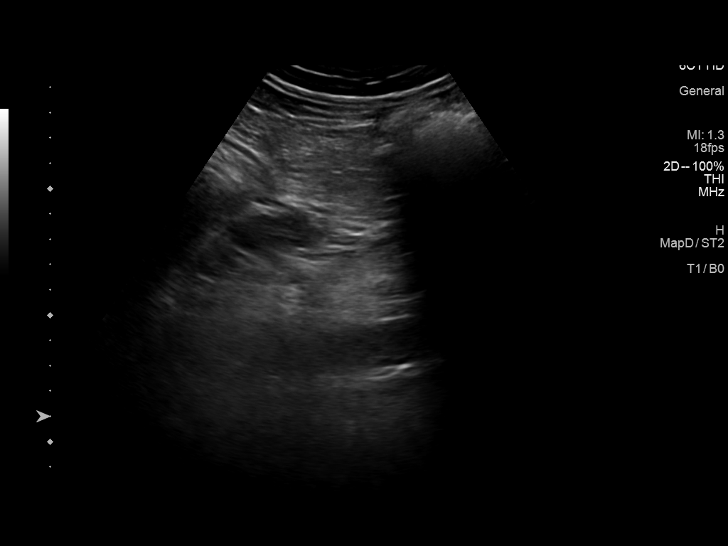
[im 7/24]
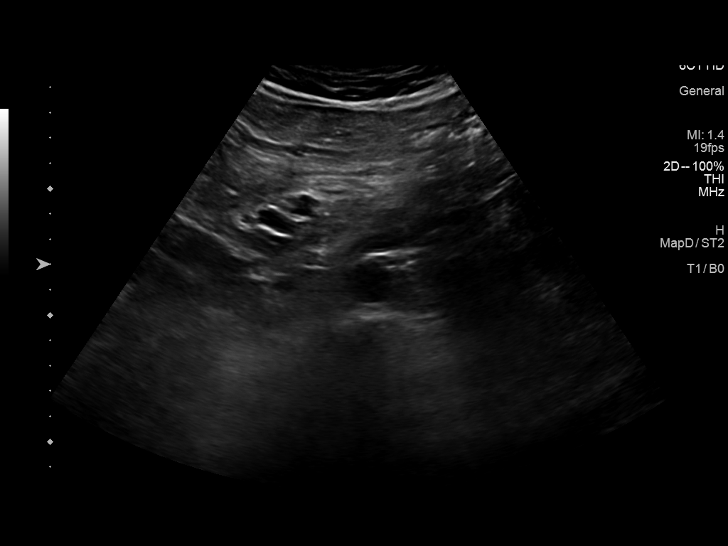
[im 8/24]
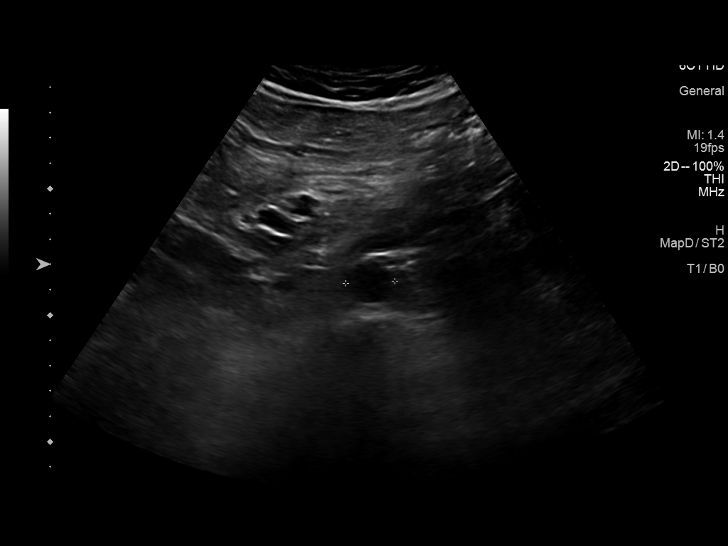
[im 10/24]
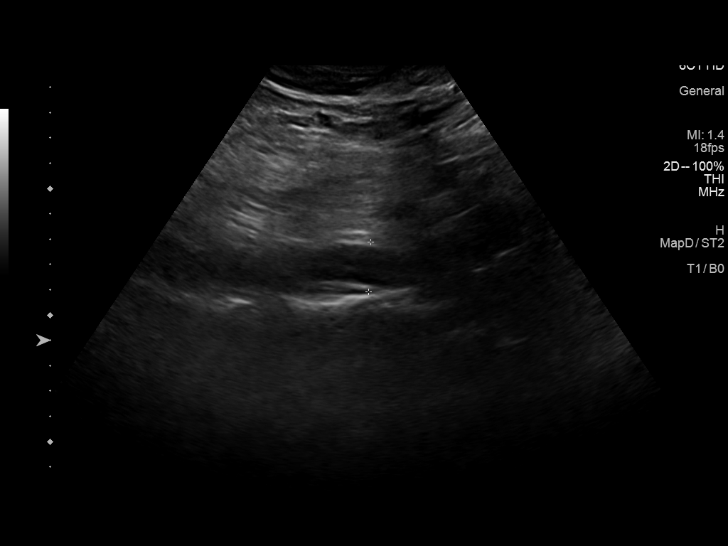
[im 12/24]
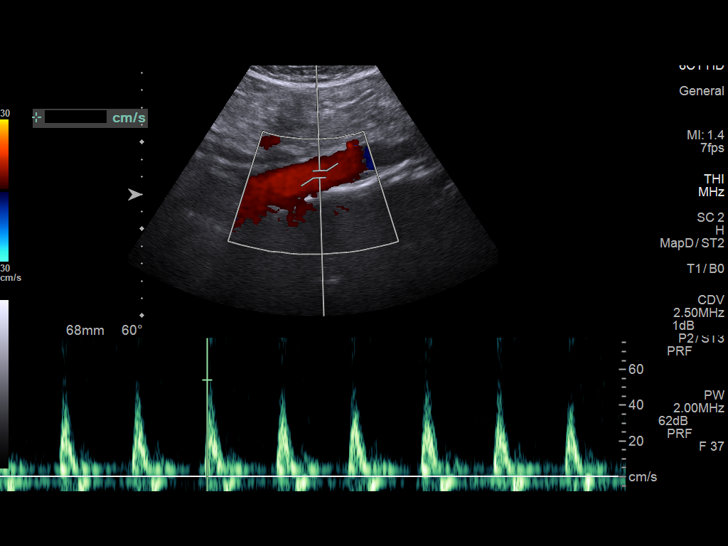
[im 13/24]
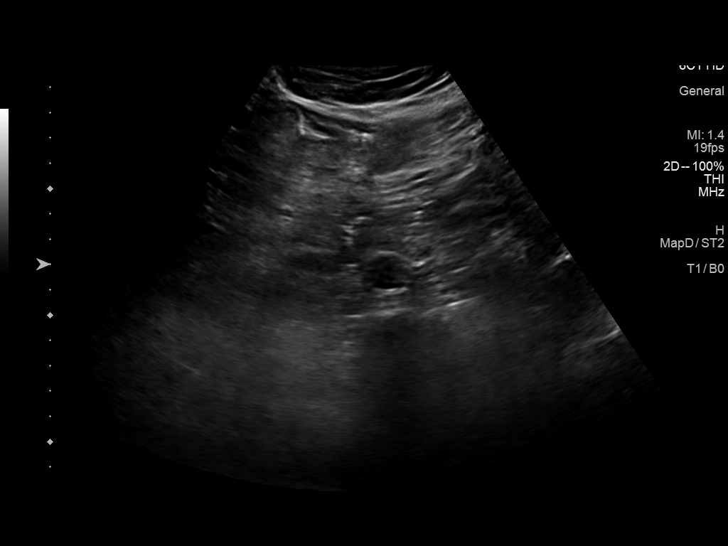
[im 15/24]
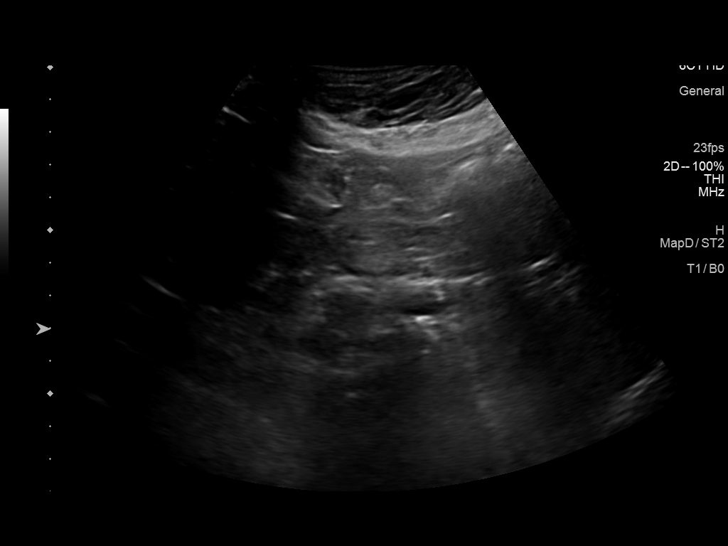
[im 17/24]
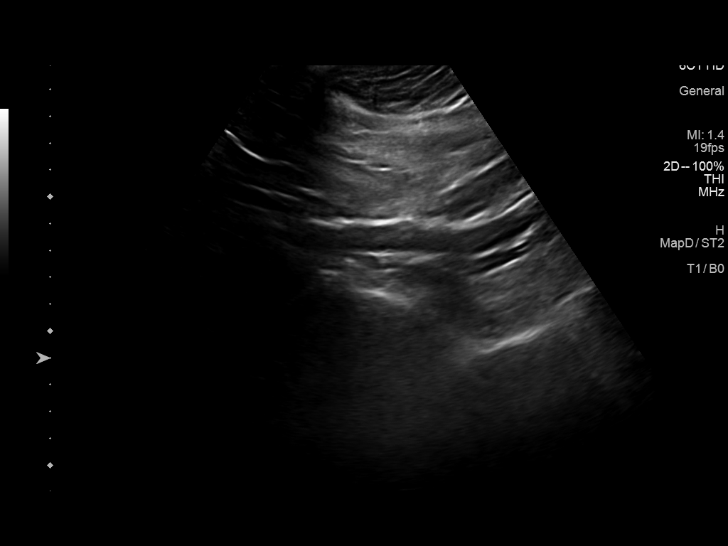
[im 19/24]
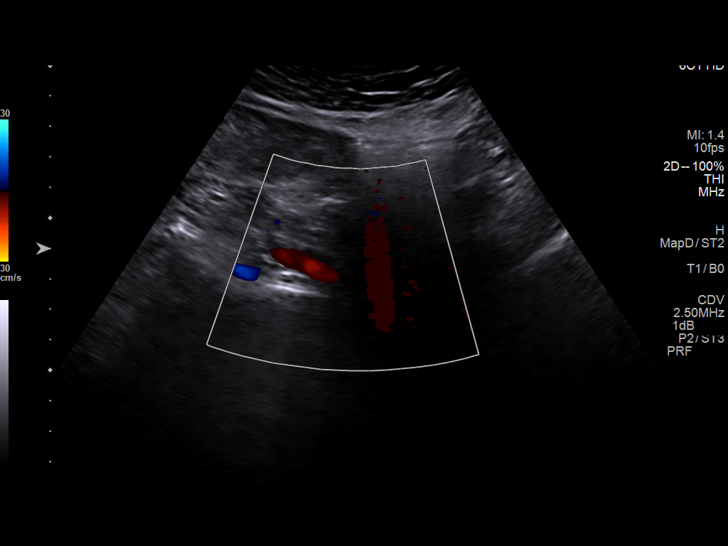
[im 20/24]
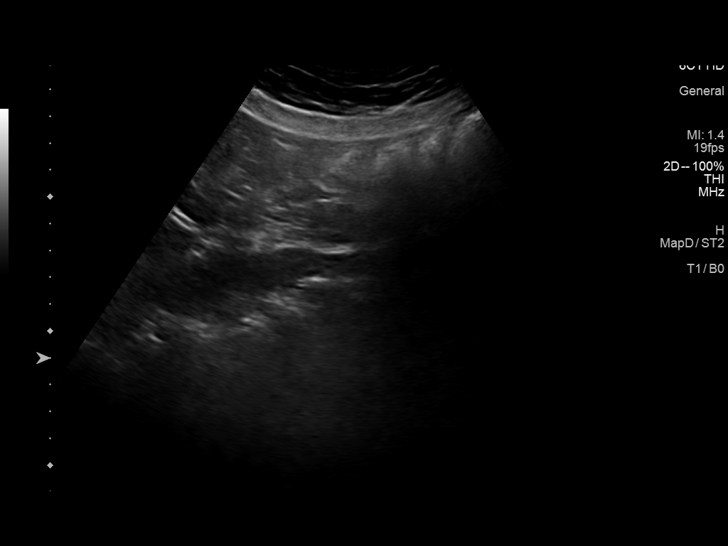
[im 22/24]
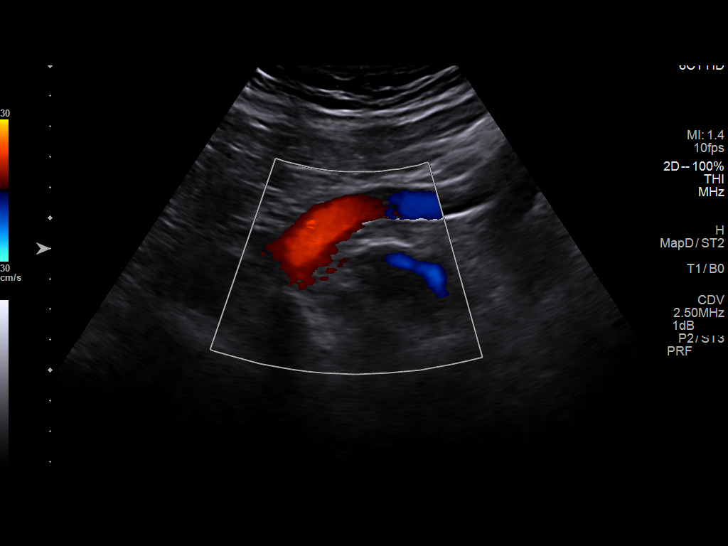
[im 24/24]
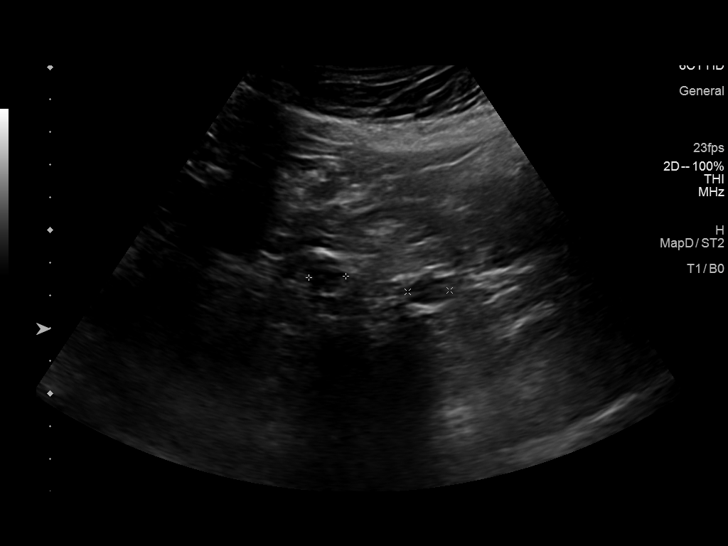

[14 of 24 positions shown; findings below may reference images not displayed]

FINDINGS: Abdominal aortic measurements as follows:

Proximal:  2.8 x 2.8 cm

Mid:  2.2 x 1.9 cm

Distal:  2.0 x 1.7 cm
Patent: Yes, peak systolic velocity is 54 cm/s

Right common iliac artery: 1.4 x 1.1 cm

Left common iliac artery: 1.2 x 1.3 cm
IMPRESSION: No abdominal aortic aneurysm identified.

## 2023-02-01 DIAGNOSIS — I129 Hypertensive chronic kidney disease with stage 1 through stage 4 chronic kidney disease, or unspecified chronic kidney disease: Secondary | ICD-10-CM | POA: Diagnosis not present

## 2023-02-01 DIAGNOSIS — N183 Chronic kidney disease, stage 3 unspecified: Secondary | ICD-10-CM | POA: Diagnosis not present

## 2023-02-01 DIAGNOSIS — E1169 Type 2 diabetes mellitus with other specified complication: Secondary | ICD-10-CM | POA: Diagnosis not present

## 2023-02-01 DIAGNOSIS — E782 Mixed hyperlipidemia: Secondary | ICD-10-CM | POA: Diagnosis not present

## 2023-02-07 DIAGNOSIS — N183 Chronic kidney disease, stage 3 unspecified: Secondary | ICD-10-CM | POA: Diagnosis not present

## 2023-02-07 DIAGNOSIS — M6284 Sarcopenia: Secondary | ICD-10-CM | POA: Diagnosis not present

## 2023-02-07 DIAGNOSIS — E1169 Type 2 diabetes mellitus with other specified complication: Secondary | ICD-10-CM | POA: Diagnosis not present

## 2023-02-07 DIAGNOSIS — I129 Hypertensive chronic kidney disease with stage 1 through stage 4 chronic kidney disease, or unspecified chronic kidney disease: Secondary | ICD-10-CM | POA: Diagnosis not present

## 2023-02-07 DIAGNOSIS — E039 Hypothyroidism, unspecified: Secondary | ICD-10-CM | POA: Diagnosis not present

## 2023-03-08 DIAGNOSIS — H25811 Combined forms of age-related cataract, right eye: Secondary | ICD-10-CM | POA: Diagnosis not present

## 2023-03-08 DIAGNOSIS — H25812 Combined forms of age-related cataract, left eye: Secondary | ICD-10-CM | POA: Diagnosis not present

## 2023-03-25 DIAGNOSIS — H25811 Combined forms of age-related cataract, right eye: Secondary | ICD-10-CM | POA: Diagnosis not present

## 2023-03-25 DIAGNOSIS — H2513 Age-related nuclear cataract, bilateral: Secondary | ICD-10-CM | POA: Diagnosis not present

## 2023-03-25 DIAGNOSIS — E1136 Type 2 diabetes mellitus with diabetic cataract: Secondary | ICD-10-CM | POA: Diagnosis not present

## 2023-03-25 DIAGNOSIS — E039 Hypothyroidism, unspecified: Secondary | ICD-10-CM | POA: Diagnosis not present

## 2023-04-15 DIAGNOSIS — E039 Hypothyroidism, unspecified: Secondary | ICD-10-CM | POA: Diagnosis not present

## 2023-04-15 DIAGNOSIS — H2513 Age-related nuclear cataract, bilateral: Secondary | ICD-10-CM | POA: Diagnosis not present

## 2023-04-15 DIAGNOSIS — I10 Essential (primary) hypertension: Secondary | ICD-10-CM | POA: Diagnosis not present

## 2023-04-15 DIAGNOSIS — H25812 Combined forms of age-related cataract, left eye: Secondary | ICD-10-CM | POA: Diagnosis not present

## 2023-04-15 DIAGNOSIS — E1136 Type 2 diabetes mellitus with diabetic cataract: Secondary | ICD-10-CM | POA: Diagnosis not present

## 2023-06-03 DIAGNOSIS — N183 Chronic kidney disease, stage 3 unspecified: Secondary | ICD-10-CM | POA: Diagnosis not present

## 2023-06-03 DIAGNOSIS — E1169 Type 2 diabetes mellitus with other specified complication: Secondary | ICD-10-CM | POA: Diagnosis not present

## 2023-06-03 DIAGNOSIS — E782 Mixed hyperlipidemia: Secondary | ICD-10-CM | POA: Diagnosis not present

## 2023-06-03 DIAGNOSIS — I129 Hypertensive chronic kidney disease with stage 1 through stage 4 chronic kidney disease, or unspecified chronic kidney disease: Secondary | ICD-10-CM | POA: Diagnosis not present

## 2023-06-03 DIAGNOSIS — E039 Hypothyroidism, unspecified: Secondary | ICD-10-CM | POA: Diagnosis not present

## 2023-06-06 DIAGNOSIS — I129 Hypertensive chronic kidney disease with stage 1 through stage 4 chronic kidney disease, or unspecified chronic kidney disease: Secondary | ICD-10-CM | POA: Diagnosis not present

## 2023-06-06 DIAGNOSIS — N183 Chronic kidney disease, stage 3 unspecified: Secondary | ICD-10-CM | POA: Diagnosis not present

## 2023-06-06 DIAGNOSIS — E039 Hypothyroidism, unspecified: Secondary | ICD-10-CM | POA: Diagnosis not present

## 2023-06-06 DIAGNOSIS — E1122 Type 2 diabetes mellitus with diabetic chronic kidney disease: Secondary | ICD-10-CM | POA: Diagnosis not present

## 2023-06-06 DIAGNOSIS — M6284 Sarcopenia: Secondary | ICD-10-CM | POA: Diagnosis not present

## 2023-08-23 DIAGNOSIS — N183 Chronic kidney disease, stage 3 unspecified: Secondary | ICD-10-CM | POA: Diagnosis not present

## 2023-08-23 DIAGNOSIS — E039 Hypothyroidism, unspecified: Secondary | ICD-10-CM | POA: Diagnosis not present

## 2023-08-23 DIAGNOSIS — M6284 Sarcopenia: Secondary | ICD-10-CM | POA: Diagnosis not present

## 2023-08-23 DIAGNOSIS — I129 Hypertensive chronic kidney disease with stage 1 through stage 4 chronic kidney disease, or unspecified chronic kidney disease: Secondary | ICD-10-CM | POA: Diagnosis not present

## 2023-08-27 DIAGNOSIS — E1122 Type 2 diabetes mellitus with diabetic chronic kidney disease: Secondary | ICD-10-CM | POA: Diagnosis not present

## 2023-08-27 DIAGNOSIS — N183 Chronic kidney disease, stage 3 unspecified: Secondary | ICD-10-CM | POA: Diagnosis not present

## 2023-08-27 DIAGNOSIS — I129 Hypertensive chronic kidney disease with stage 1 through stage 4 chronic kidney disease, or unspecified chronic kidney disease: Secondary | ICD-10-CM | POA: Diagnosis not present

## 2023-10-16 ENCOUNTER — Encounter: Payer: Self-pay | Admitting: *Deleted

## 2023-10-16 NOTE — Progress Notes (Signed)
 Johnny Smith                                          MRN: [id number]   10/16/2023   The [facility] Quality Team Specialist reviewed this patient medical record for the purposes of chart review for care gap closure. The following were reviewed: chart review for care gap closure-glycemic status assessment and kidney health evaluation for diabetes:eGFR  and uACR.    [facility]I Quality Team

## 2023-11-01 ENCOUNTER — Ambulatory Visit: Admission: EM | Admit: 2023-11-01 | Discharge: 2023-11-01 | Disposition: A | Discharge status: Home / Self Care

## 2023-11-01 ENCOUNTER — Encounter: Payer: Self-pay | Admitting: Emergency Medicine

## 2023-11-01 DIAGNOSIS — Z860100 Personal history of colon polyps, unspecified: Secondary | ICD-10-CM | POA: Insufficient documentation

## 2023-11-01 DIAGNOSIS — K529 Noninfective gastroenteritis and colitis, unspecified: Secondary | ICD-10-CM | POA: Diagnosis not present

## 2023-11-01 DIAGNOSIS — Z1211 Encounter for screening for malignant neoplasm of colon: Secondary | ICD-10-CM | POA: Insufficient documentation

## 2023-11-01 MED ORDER — ONDANSETRON 8 MG PO TBDP: 8.0000 mg | ORAL_TABLET | Freq: Three times a day (TID) | ORAL | 0 refills | Status: AC | PRN

## 2023-11-01 NOTE — Discharge Instructions (Signed)

## 2023-11-01 NOTE — ED Provider Notes (Addendum)
 [facility]    CSN: [id number] Arrival date & time: 11/01/23  1541      History   Chief Complaint Chief Complaint  Patient presents with   Emesis   Abdominal Pain    HPI Johnny Smith is a 73 y.o. male.   Patient presents today due to abdominal pain, nausea, and 2 episodes of vomiting that started today at 11 AM.  About 11 hours before symptoms started he ate Vienna sausages, cucumber, and tomato.  Patient states that he was up for about an hour before he went to bed.  Patient states that in office now he is only experiencing 2/10 pain.  Patient states he took some metoclopramide  this morning for nausea but ended up vomiting.  Patient denies diarrhea, chest pain, headache, blurred vision, upper back pain, or shortness of breath.   The history is provided by the patient.  Emesis Associated symptoms: abdominal pain   Abdominal Pain Associated symptoms: vomiting     Past Medical History:  Diagnosis Date   Chronic renal insufficiency    Diabetes mellitus without complication (HCC)    GERD (gastroesophageal reflux disease)    High cholesterol    Hypertension    Hypothyroidism    Pneumonia    Prostate cancer (HCC) 10/28/2014    Patient Active Problem List   Diagnosis Date Noted   Colon cancer screening 11/01/2023   History of colonic polyps 11/01/2023   Hyperlipidemia 01/13/2016   Gastroesophageal reflux disease 01/12/2016   Prostate cancer (HCC) 03/10/2015   Malignant neoplasm of prostate (HCC) 11/17/2014   Hypertension 11/11/2012   Type 2 diabetes mellitus without complications (HCC) 07/17/2012   Hypothyroidism 07/16/2012   Dyslipidemia 07/16/2012   Balanitis 07/16/2012    Past Surgical History:  Procedure Laterality Date   APPENDECTOMY  1991   COLONOSCOPY  2005   POLYP REMOVED   LYMPHADENECTOMY Bilateral 03/10/2015   Procedure: LYMPHADENECTOMY;  Surgeon: Gretel Ferrara, MD;  Location: [facility];  Service: Urology;  Laterality: Bilateral;   PROSTATE  BIOPSY  10/28/14   ROBOT ASSISTED LAPAROSCOPIC RADICAL PROSTATECTOMY N/A 03/10/2015   Procedure: ROBOTIC ASSISTED LAPAROSCOPIC RADICAL PROSTATECTOMY LEVEL 2;  Surgeon: Gretel Ferrara, MD;  Location: [facility];  Service: Urology;  Laterality: N/A;       Home Medications    Prior to Admission medications   Medication Sig Start Date End Date Taking? Authorizing Provider  amLODipine -valsartan  (EXFORGE ) 5-320 MG tablet  06/24/23  Yes [provider]  aspirin  EC 81 MG tablet Take 1 tablet (81 mg total) by mouth daily. 07/26/16  Yes Hairston, Alston SAUNDERS, FNP  atorvastatin  (LIPITOR) 40 MG tablet Take 1 tablet (40 mg total) by mouth daily. 08/03/16  Yes Hairston, Mandesia R, FNP  DULoxetine  (CYMBALTA ) 30 MG capsule Take 1 capsule (30 mg total) by mouth daily. 05/10/16  Yes Patel, Ankit Anil, MD  ferrous sulfate 325 (65 FE) MG EC tablet Take 325 mg by mouth daily.   Yes [provider]  glipiZIDE  (GLUCOTROL ) 10 MG tablet TAKE 1 TABLET TWICE A DAY  BEFORE A MEAL 07/26/16  Yes Hairston, Mandesia R, FNP  hydrochlorothiazide  (HYDRODIURIL ) 25 MG tablet Take 1 tablet (25 mg total) by mouth daily. 07/26/16  Yes Durenda Alston SAUNDERS, FNP  JARDIANCE 25 MG TABS tablet  08/07/23  Yes [provider]  levothyroxine  (SYNTHROID ) 100 MCG tablet Take 100 mcg by mouth daily before breakfast.   Yes [provider]  metFORMIN  (GLUCOPHAGE ) 1000 MG tablet Take 1 tablet (1,000 mg total)  by mouth 2 (two) times daily with a meal. 07/26/16  Yes Hairston, Alston SAUNDERS, FNP  metoprolol  succinate (TOPROL -XL) 100 MG 24 hr tablet Take 1 tablet (100 mg total) by mouth daily. Take with or immediately following a meal. 07/26/16  Yes Durenda Alston SAUNDERS, FNP  MOUNJARO 15 MG/0.5ML Pen  07/15/23  Yes [provider]  omeprazole  (PRILOSEC) 40 MG capsule Take 1 capsule (40 mg total) by mouth daily as needed. 07/26/16  Yes Hairston, Alston SAUNDERS, FNP  ondansetron  (ZOFRAN -ODT) 8 MG disintegrating tablet Take 1 tablet  (8 mg total) by mouth every 8 (eight) hours as needed for nausea or vomiting. 11/01/23  Yes Andra Krabbe C, PA-C  pioglitazone  (ACTOS ) 30 MG tablet TAKE 1 TABLET DAILY 11/26/16  Yes Hairston, Mandesia R, FNP  simvastatin  (ZOCOR ) 20 MG tablet 1 tablet in the evening Orally Once a day   Yes [provider]  amLODipine -valsartan  (EXFORGE ) 10-320 MG tablet Take 1 tablet by mouth daily. Patient not taking: Reported on 11/01/2023 07/26/16   Durenda Alston SAUNDERS, FNP  amLODIPine -Valsartan -HCTZ 10-320-25 MG TABS 1 tablet Orally Once a day Patient not taking: Reported on 11/01/2023    [provider]  ibuprofen  (ADVIL ,MOTRIN ) 600 MG tablet Take 1 tablet (600 mg total) by mouth 3 (three) times daily as needed. Patient not taking: Reported on 11/01/2023 03/01/16   Patel, Ankit Anil, MD  ketoconazole (NIZORAL) 2 % shampoo Apply topically. Patient not taking: Reported on 11/01/2023 08/31/23   [provider]  levothyroxine  (SYNTHROID ) 88 MCG tablet  06/06/23   [provider]  levothyroxine  (SYNTHROID , LEVOTHROID) 125 MCG tablet Take 1 tablet (125 mcg total) by mouth daily before breakfast. Patient not taking: Reported on 11/01/2023 08/03/16   Durenda Alston SAUNDERS, FNP  methocarbamol  (ROBAXIN ) 500 MG tablet Take 1 tablet (500 mg total) by mouth 2 (two) times daily as needed for muscle spasms. Patient not taking: Reported on 11/01/2023 03/01/16   Tobie Burgess [place], MD  Metoclopramide  HCl 10 MG TBDP 1 tablet before meals and bedtime Orally four times a day Patient not taking: Reported on 11/01/2023    [provider]  omega-3 acid ethyl esters (LOVAZA ) 1 g capsule Take 1 capsule (1 g total) by mouth 2 (two) times daily. Patient not taking: Reported on 11/01/2023 08/03/16   Durenda Alston SAUNDERS, FNP  Potassium Chloride  ER 20 MEQ TBCR 1 tablet with food Orally Once a day Patient not taking: Reported on 11/01/2023    [provider]  sitaGLIPtin  (JANUVIA ) 100 MG  tablet Take 1 tablet (100 mg total) by mouth daily. Patient not taking: Reported on 11/01/2023 07/26/16   Durenda Alston SAUNDERS, FNP  traMADol  (ULTRAM ) 50 MG tablet Take 1 tablet (50 mg total) by mouth 2 (two) times daily as needed. Patient not taking: Reported on 11/01/2023 03/01/16   Tobie Burgess [place], MD    Family History Family History  Problem Relation Age of Onset   Stroke Mother    Heart attack Father    Prostate cancer Maternal Uncle    Cancer Maternal Uncle    Uterine cancer Paternal Grandmother    Lupus Sister    Diabetes Sister    Cancer Maternal Grandfather     Social History Social History   Tobacco Use   Smoking status: Never   Smokeless tobacco: Never  Vaping Use   Vaping status: Never Used  Substance Use Topics   Alcohol use: No   Drug use: No     Allergies   Patient  has no known allergies.   Review of Systems Review of Systems  Gastrointestinal:  Positive for abdominal pain and vomiting.     Physical Exam Triage Vital Signs ED Triage Vitals  Encounter Vitals Group     BP 11/01/23 1635 (!) 144/83     Girls Systolic BP Percentile --      Girls Diastolic BP Percentile --      Boys Systolic BP Percentile --      Boys Diastolic BP Percentile --      Pulse Rate 11/01/23 1635 74     Resp 11/01/23 1635 20     Temp 11/01/23 1635 (!) 97.4 F (36.3 C)     Temp Source 11/01/23 1635 Oral     SpO2 11/01/23 1635 96 %     Weight --      Height --      Head Circumference --      Peak Flow --      Pain Score 11/01/23 1650 2     Pain Loc --      Pain Education --      Exclude from Growth Chart --    No data found.  Updated Vital Signs BP (!) 144/83 (BP Location: Left Arm)   Pulse 74   Temp (!) 97.4 F (36.3 C) (Oral)   Resp 20   SpO2 96%   Visual Acuity Right Eye Distance:   Left Eye Distance:   Bilateral Distance:    Right Eye Near:   Left Eye Near:    Bilateral Near:     Physical Exam Vitals and nursing note reviewed.   Constitutional:      General: He is not in acute distress.    Appearance: Normal appearance. He is not ill-appearing, toxic-appearing or diaphoretic.  Eyes:     General: No scleral icterus. Cardiovascular:     Rate and Rhythm: Normal rate and regular rhythm.     Heart sounds: Normal heart sounds.  Pulmonary:     Effort: Pulmonary effort is normal. No respiratory distress.     Breath sounds: Normal breath sounds. No wheezing or rhonchi.  Abdominal:     General: Abdomen is flat. Bowel sounds are normal.     Palpations: Abdomen is soft.     Tenderness: There is no abdominal tenderness. There is no right CVA tenderness or left CVA tenderness.  Skin:    General: Skin is warm.  Neurological:     Mental Status: He is alert and oriented to person, place, and time.  Psychiatric:        Mood and Affect: Mood normal.        Behavior: Behavior normal.      UC Treatments / Results  Labs (all labs ordered are listed, but only abnormal results are displayed) Labs Reviewed - No data to display  EKG   Radiology No results found.  Procedures Procedures (including critical care time)  Medications Ordered in UC Medications - No data to display  Initial Impression / Assessment and Plan / UC Course  I have reviewed the triage vital signs and the nursing notes.  Pertinent labs & imaging results that were available during my care of the patient were reviewed by me and considered in my medical decision making (see chart for details).     Patient appears in no acute distress and physical exam was unremarkable.  Symptoms most likely due to acute gastroenteritis advised patient to practice BRAT diet for the next couple of days.  Final Clinical Impressions(s) / UC Diagnoses   Final diagnoses:  Acute gastroenteritis     Discharge Instructions      You have been diagnosed with a gastrointestinal issue today with symptoms of nausea, vomiting, and/or diarrhea.  It is best that you eat a  bland diet which includes dry toast, applesauce, bananas, chicken broth, plain rice, or plain mashed potatoes eat these things with no salt-and-pepper, excessive butter, or other seasonings.  It is also important to drink plenty of fluids as you are losing a lot of electrolytes due to vomiting and having diarrhea.  Diluted Gatorade, water , Pedialyte, liquid IV, etc. are good choices for fluid intake.  You may also use popsicles and/or Italian ice.  It is recommended that you do not use anything to stop your diarrhea as it is your body's way of getting rid of the offending bug.  If your symptoms are not improving within 3 to 5 days, you are experiencing significant fatigue, or you are urinating less frequently you will need to follow-up with your PCP or report to the ER.     ED Prescriptions     Medication Sig Dispense Auth. Provider   ondansetron  (ZOFRAN -ODT) 8 MG disintegrating tablet Take 1 tablet (8 mg total) by mouth every 8 (eight) hours as needed for nausea or vomiting. 20 tablet Andra Corean BROCKS, PA-C      PDMP not reviewed this encounter.   Andra Corean BROCKS, PA-C 11/01/23 1737    Andra Corean BROCKS, PA-C 11/01/23 1739

## 2023-11-01 NOTE — ED Triage Notes (Addendum)
 Pt reports 2 emesis episodes and upper abdominal pain that started at 11am today. Pt states he ate cornbread and milk and felt a sharp pain in upper abdomen then vomited shortly after. Denies ongoing nausea. Pt tried to drink water  later today and vomited it 20 mins later. Again, denies ongoing nausea after the incident. Pt reports sharp upper abdominal pain in both RUQ/LUQ that has improved significantly after the emesis episodes. Some pain remains but much milder than this morning. Denies diarrhea and fevers. Pt reports no new foods or medications. States he felt fine when he went to bed last night - he ate vienna sausage, tomatoes, and cucumbers at 10pm. Pt has taken gas-x since the incidents.   Pt did take mounjaro injection yesterday at 1pm. Reports taking it for 2-30yrs with no prior issues.

## 2023-11-05 ENCOUNTER — Other Ambulatory Visit: Payer: Self-pay | Admitting: Family Medicine

## 2023-11-05 DIAGNOSIS — R112 Nausea with vomiting, unspecified: Secondary | ICD-10-CM

## 2023-11-05 DIAGNOSIS — R945 Abnormal results of liver function studies: Secondary | ICD-10-CM

## 2023-11-11 ENCOUNTER — Ambulatory Visit
Admission: RE | Admit: 2023-11-11 | Discharge: 2023-11-11 | Disposition: A | Discharge status: Home / Self Care | Source: Ambulatory Visit | Attending: Family Medicine | Admitting: Family Medicine

## 2023-11-11 DIAGNOSIS — R112 Nausea with vomiting, unspecified: Secondary | ICD-10-CM

## 2023-11-11 DIAGNOSIS — R945 Abnormal results of liver function studies: Secondary | ICD-10-CM

## 2023-11-11 MED ORDER — IOPAMIDOL (ISOVUE-300) INJECTION 61%
100.0000 mL | Freq: Once | INTRAVENOUS | Status: AC | PRN
  Administered 2023-11-11: 100 mL via INTRAVENOUS
